# Patient Record
Sex: Female | Born: 1975 | Race: White | Hispanic: No | Marital: Married | Smoking: Never smoker
Health system: Southern US, Community
[De-identification: ages and names within clinical notes are randomized; demographics above are authoritative.]

## PROBLEM LIST (undated history)

## (undated) DIAGNOSIS — E039 Hypothyroidism, unspecified: Secondary | ICD-10-CM

## (undated) DIAGNOSIS — J45909 Unspecified asthma, uncomplicated: Secondary | ICD-10-CM

## (undated) DIAGNOSIS — B019 Varicella without complication: Secondary | ICD-10-CM

## (undated) DIAGNOSIS — E079 Disorder of thyroid, unspecified: Secondary | ICD-10-CM

## (undated) DIAGNOSIS — Z8679 Personal history of other diseases of the circulatory system: Secondary | ICD-10-CM

## (undated) DIAGNOSIS — R011 Cardiac murmur, unspecified: Secondary | ICD-10-CM

## (undated) DIAGNOSIS — K635 Polyp of colon: Secondary | ICD-10-CM

## (undated) DIAGNOSIS — R001 Bradycardia, unspecified: Secondary | ICD-10-CM

## (undated) DIAGNOSIS — K529 Noninfective gastroenteritis and colitis, unspecified: Secondary | ICD-10-CM

## (undated) DIAGNOSIS — R002 Palpitations: Secondary | ICD-10-CM

## (undated) DIAGNOSIS — I499 Cardiac arrhythmia, unspecified: Secondary | ICD-10-CM

## (undated) DIAGNOSIS — I10 Essential (primary) hypertension: Secondary | ICD-10-CM

## (undated) DIAGNOSIS — Z9889 Other specified postprocedural states: Secondary | ICD-10-CM

## (undated) DIAGNOSIS — E0500 Thyrotoxicosis with diffuse goiter without thyrotoxic crisis or storm: Secondary | ICD-10-CM

## (undated) DIAGNOSIS — R112 Nausea with vomiting, unspecified: Secondary | ICD-10-CM

## (undated) DIAGNOSIS — Z973 Presence of spectacles and contact lenses: Secondary | ICD-10-CM

## (undated) DIAGNOSIS — M7989 Other specified soft tissue disorders: Secondary | ICD-10-CM

## (undated) DIAGNOSIS — I341 Nonrheumatic mitral (valve) prolapse: Secondary | ICD-10-CM

## (undated) DIAGNOSIS — M1990 Unspecified osteoarthritis, unspecified site: Secondary | ICD-10-CM

## (undated) DIAGNOSIS — K589 Irritable bowel syndrome without diarrhea: Secondary | ICD-10-CM

## (undated) DIAGNOSIS — T7840XA Allergy, unspecified, initial encounter: Secondary | ICD-10-CM

## (undated) DIAGNOSIS — R519 Headache, unspecified: Secondary | ICD-10-CM

## (undated) DIAGNOSIS — R51 Headache: Secondary | ICD-10-CM

## (undated) HISTORY — DX: Polyp of colon: K63.5

## (undated) HISTORY — DX: Noninfective gastroenteritis and colitis, unspecified: K52.9

## (undated) HISTORY — DX: Essential (primary) hypertension: I10

## (undated) HISTORY — DX: Varicella without complication: B01.9

## (undated) HISTORY — PX: TRANSTHORACIC ECHOCARDIOGRAM: SHX275

## (undated) HISTORY — DX: Allergy, unspecified, initial encounter: T78.40XA

## (undated) HISTORY — DX: Disorder of thyroid, unspecified: E07.9

## (undated) HISTORY — PX: TONSILECTOMY/ADENOIDECTOMY WITH MYRINGOTOMY: SHX6125

## (undated) HISTORY — PX: COLONOSCOPY: SHX174

## (undated) HISTORY — DX: Irritable bowel syndrome without diarrhea: K58.9

## (undated) HISTORY — DX: Unspecified osteoarthritis, unspecified site: M19.90

## (undated) HISTORY — DX: Palpitations: R00.2

## (undated) HISTORY — PX: MYRINGOTOMY: SUR874

---

## 1998-01-07 DIAGNOSIS — R002 Palpitations: Secondary | ICD-10-CM

## 1998-01-07 HISTORY — DX: Palpitations: R00.2

## 1999-01-08 HISTORY — PX: CHOLECYSTECTOMY: SHX55

## 2002-01-07 DIAGNOSIS — K589 Irritable bowel syndrome without diarrhea: Secondary | ICD-10-CM

## 2002-01-07 DIAGNOSIS — K529 Noninfective gastroenteritis and colitis, unspecified: Secondary | ICD-10-CM

## 2002-01-07 HISTORY — DX: Noninfective gastroenteritis and colitis, unspecified: K52.9

## 2002-01-07 HISTORY — DX: Irritable bowel syndrome, unspecified: K58.9

## 2004-12-27 ENCOUNTER — Ambulatory Visit: Payer: Self-pay

## 2006-05-26 ENCOUNTER — Ambulatory Visit: Payer: Self-pay | Admitting: Family Medicine

## 2006-05-28 ENCOUNTER — Ambulatory Visit: Payer: Self-pay | Admitting: Internal Medicine

## 2006-10-04 LAB — HM COLONOSCOPY: HM Colonoscopy: NORMAL

## 2006-10-13 ENCOUNTER — Ambulatory Visit: Payer: Self-pay | Admitting: Internal Medicine

## 2006-11-05 ENCOUNTER — Ambulatory Visit: Payer: Self-pay | Admitting: Gastroenterology

## 2006-12-10 ENCOUNTER — Ambulatory Visit: Payer: Self-pay | Admitting: Internal Medicine

## 2007-10-08 ENCOUNTER — Encounter: Admission: RE | Admit: 2007-10-08 | Discharge: 2007-10-08 | Payer: Self-pay | Admitting: Oral Surgery

## 2008-01-08 HISTORY — PX: TUBAL LIGATION: SHX77

## 2008-06-24 ENCOUNTER — Inpatient Hospital Stay (HOSPITAL_COMMUNITY): Admission: RE | Admit: 2008-06-24 | Discharge: 2008-06-27 | Payer: Self-pay | Admitting: Obstetrics and Gynecology

## 2008-06-24 ENCOUNTER — Encounter (INDEPENDENT_AMBULATORY_CARE_PROVIDER_SITE_OTHER): Payer: Self-pay | Admitting: Obstetrics and Gynecology

## 2008-07-08 ENCOUNTER — Inpatient Hospital Stay (HOSPITAL_COMMUNITY): Admission: AD | Admit: 2008-07-08 | Discharge: 2008-07-08 | Payer: Self-pay | Admitting: Obstetrics and Gynecology

## 2008-09-30 ENCOUNTER — Ambulatory Visit: Payer: Self-pay | Admitting: Anesthesiology

## 2008-10-11 ENCOUNTER — Encounter: Payer: Self-pay | Admitting: Internal Medicine

## 2008-11-07 ENCOUNTER — Encounter: Payer: Self-pay | Admitting: Internal Medicine

## 2010-01-07 HISTORY — PX: CARPAL TUNNEL RELEASE: SHX101

## 2010-04-15 LAB — URINALYSIS, ROUTINE W REFLEX MICROSCOPIC
Bilirubin Urine: NEGATIVE
Glucose, UA: NEGATIVE mg/dL
Specific Gravity, Urine: <1.005 — ABNORMAL LOW (ref 1.005–1.030)
Urobilinogen, UA: 0.2 mg/dL (ref 0.0–1.0)
pH: 6.5 (ref 5.0–8.0)

## 2010-04-15 LAB — URINE MICROSCOPIC-ADD ON

## 2010-04-15 LAB — COMPREHENSIVE METABOLIC PANEL
ALT: 17 U/L (ref 0–35)
AST: 14 U/L (ref 0–37)
Alkaline Phosphatase: 55 U/L (ref 39–117)
BUN: 6 mg/dL (ref 6–23)
Calcium: 8.9 mg/dL (ref 8.4–10.5)
Total Bilirubin: 1.1 mg/dL (ref 0.3–1.2)
Total Protein: 6.3 g/dL (ref 6.0–8.3)

## 2010-04-15 LAB — CBC
HCT: 33.9 % — ABNORMAL LOW (ref 36.0–46.0)
MCV: 92.3 fL (ref 78.0–100.0)
Platelets: 253 10*3/uL (ref 150–400)
RBC: 3.68 MIL/uL — ABNORMAL LOW (ref 3.87–5.11)
RDW: 15 % (ref 11.5–15.5)
WBC: 9.9 10*3/uL (ref 4.0–10.5)

## 2010-04-15 LAB — URINE CULTURE: Colony Count: 60000

## 2010-04-16 LAB — CBC
HCT: 32 % — ABNORMAL LOW (ref 36.0–46.0)
Hemoglobin: 9.9 g/dL — ABNORMAL LOW (ref 12.0–15.0)
MCV: 95.9 fL (ref 78.0–100.0)
RBC: 3.38 MIL/uL — ABNORMAL LOW (ref 3.87–5.11)
WBC: 9.6 10*3/uL (ref 4.0–10.5)

## 2010-05-22 NOTE — H&P (Signed)
[name], [name]                  ACCOUNT NO.:  [id number]   MEDICAL RECORD NO.:  [id number]          PATIENT TYPE:  MAT   LOCATION:  [facility]                          FACILITY:  [facility]   PHYSICIAN:  Lenoard Aden, M.D.DATE OF BIRTH:  02/12/1975   DATE OF ADMISSION:  07/08/2008  DATE OF DISCHARGE:  07/08/2008                              HISTORY & PHYSICAL   CHIEF COMPLAINT:  Headache with hypertension.   The patient is a 32-year white female, G2, P2, now 1 week and 10-12  days, status post uncomplicated repeat C-section, tubal ligation, who  presents with onset of headache and increased discomfort today.  She is  a nonsmoker, nondrinker.  Denies domestic or physical violence.   MEDICATIONS:  Prenatal vitamins, Percocet, and ibuprofen.   FAMILY HISTORY:  Noncontributory.   SOCIAL HISTORY:  History of 2 C-sections as noted with tubal ligation.   PHYSICAL EXAMINATION:  GENERAL:  She is a well-developed and well-  nourished white female in no acute distress.  VITAL SIGNS:  Initial blood pressure is 171/91.  Recent blood pressure  is 144/70.  HEENT:  Normal.  LUNGS:  Clear.  HEART:  Regular rhythm.  ABDOMEN:  Soft, obese, and nontender.  Incision is well-healed.  EXTREMITIES:  No cords.  NEUROLOGIC:  Nonfocal.  SKIN:  Intact.   LABORATORY DATA:  Normal CBC with platelet count of 253,000.  Urinalysis  with large hemoglobin and moderate leukocyte esterase.  A complete  metabolic panel, which was within normal limits with normal SGOT, normal  SGPT, and normal LDH.   IMPRESSION:  Gestational hypertension with postpartum exacerbation.   PLAN:  Discharge home.  Pre-eclamptic precautions given.  Seizure  precautions given.  Labetalol 100 mg p.o. b.i.d. given.  Percocet 1-2  p.o. q.4-6 h.  Followup in the office in 4 days.  Precautions given.      Lenoard Aden, M.D.  Electronically Signed    RJT/MEDQ  D:  07/08/2008  T:  07/09/2008  Job:  [id number]

## 2010-05-22 NOTE — Op Note (Signed)
[name], [name]                  ACCOUNT NO.:  [id number]   MEDICAL RECORD NO.:  [id number]          PATIENT TYPE:  INP   LOCATION:  9129                          [facility]ACILITY:  [facility]   PHYSICIAN:  Lenoard Aden, M.D.DATE OF BIRTH:  [birth year]   DATE OF PROCEDURE:  06/24/2008  DATE OF DISCHARGE:                               OPERATIVE REPORT   PREOPERATIVE DIAGNOSES:  Previous cesarean section, 39 and 0/7 weeks,  desire for elective sterilization.   POSTOPERATIVE DIAGNOSES:  Previous cesarean section, 39 and 0/7 weeks,  desire for elective sterilization.   PROCEDURES:  Repeat cesarean section, lysis of adhesions, and tubal  ligation.   SURGEON:  Lenoard Aden, MD   ASSISTANT:  Dineen Kid. Rana Snare, MD   ANESTHESIA:  Spinal by [place].   ESTIMATED BLOOD LOSS:  900 mL.   COMPLICATIONS:  None.   DRAINS:  Foley.   COUNTS:  Correct.   DISPOSITION:  The patient to Recovery in good condition.   FINDINGS:  Full-term living female, occiput transverse position, vacuum  assistance one pole, Apgars 9 and 9, pediatricians in attendance.  Normal tubes, normal ovaries, and omental adhesions to lower uterine  segment and bladder flap.   BRIEF OPERATIVE NOTE:  After being apprised of risks of anesthesia,  infection, bleeding, need for repair, delayed versus immediate  complications to include bowel and bladder injury, failure risk of tubal  ligation, 5-10 per 1000. The patient brought to the operating, she was  administered spinal anesthetic without complications, and prepped and  draped in usual sterile fashion.  Foley catheter placed after achieving  adequate anesthesia, dilute Marcaine solution placed.  Pfannenstiel skin  incision was made with scalpel, carried down to fascia which nicked in  the midline and opened transverse using Mayo scissors.  Peritoneum  entered sharply and then the omentum was encountered, draped along the  lower uterine segment and bladder flap.  This was  lysed using  electrocautery with caution to avoid the bladder.  No bowel was involved  in the adhesive process.  After clearing of the adhesions in the lower  uterine segment, the visceral peritoneum scored off the lower uterine  segment.  Sharl Ma hysterotomy incision was made, atraumatic delivery of a  full term living female with vacuum assistance x1 pull, handed to  pediatricians staff in attendance.  Apgars 9 and 9.  Cord blood  collected.  Placenta delivered manually intact.  Uterus exteriorized,  curetted using a dry lap pack and closed in 1 running layer of 0  Monocryl suture.  Good hemostasis noted.  Left tube traced out to the  fimbriated end and ampullary-isthmic portion of the tube was isolated.  Mesosalpinx was cauterized, creating a window in an avascular portion, 0  plain ties were placed x2.  Tubal segment excised after this procedure  was done on the left tube.  The same exact procedure was done on the  right tube and tubal segments excised as well.  Tubal segments and tubal  lumens were visualized, cauterized, and good hemostasis noted.  Uterus  was replaced and the abdominal  cavity reinspected.  Irrigation was  accomplished.  Good hemostasis was ensured.  Fascia then closed using  the 0 Monocryl in a continuous running fashion.  Skin was closed using  staples.  The patient tolerated the procedure well and transferred to  recovery in good condition.      Lenoard Aden, M.D.  Electronically Signed     RJT/MEDQ  D:  06/24/2008  T:  06/24/2008  Job:  [id number]

## 2010-07-04 LAB — HM PAP SMEAR: HM Pap smear: NORMAL

## 2010-09-06 ENCOUNTER — Ambulatory Visit: Payer: Self-pay | Admitting: Orthopedic Surgery

## 2010-09-11 LAB — PATHOLOGY REPORT

## 2010-11-23 ENCOUNTER — Ambulatory Visit: Payer: Self-pay | Admitting: Internal Medicine

## 2012-01-08 HISTORY — PX: OTHER SURGICAL HISTORY: SHX169

## 2012-07-22 ENCOUNTER — Ambulatory Visit: Payer: Self-pay | Admitting: Internal Medicine

## 2012-09-01 ENCOUNTER — Other Ambulatory Visit: Payer: Self-pay | Admitting: Internal Medicine

## 2012-09-01 DIAGNOSIS — R1310 Dysphagia, unspecified: Secondary | ICD-10-CM

## 2012-09-11 ENCOUNTER — Ambulatory Visit
Admission: RE | Admit: 2012-09-11 | Discharge: 2012-09-11 | Disposition: A | Discharge status: Home / Self Care | Payer: 59 | Source: Ambulatory Visit | Attending: Internal Medicine | Admitting: Internal Medicine

## 2012-09-11 DIAGNOSIS — R1310 Dysphagia, unspecified: Secondary | ICD-10-CM

## 2012-10-02 ENCOUNTER — Ambulatory Visit (INDEPENDENT_AMBULATORY_CARE_PROVIDER_SITE_OTHER): Payer: 59 | Admitting: Internal Medicine

## 2012-10-02 ENCOUNTER — Encounter: Payer: Self-pay | Admitting: Internal Medicine

## 2012-10-02 VITALS — BP 132/78 | HR 80 | Temp 97.9°F | Resp 14 | Ht 64.75 in | Wt 196.8 lb

## 2012-10-02 DIAGNOSIS — E559 Vitamin D deficiency, unspecified: Secondary | ICD-10-CM

## 2012-10-02 DIAGNOSIS — I471 Supraventricular tachycardia, unspecified: Secondary | ICD-10-CM

## 2012-10-02 DIAGNOSIS — E039 Hypothyroidism, unspecified: Secondary | ICD-10-CM

## 2012-10-02 DIAGNOSIS — K5289 Other specified noninfective gastroenteritis and colitis: Secondary | ICD-10-CM

## 2012-10-02 DIAGNOSIS — R928 Other abnormal and inconclusive findings on diagnostic imaging of breast: Secondary | ICD-10-CM

## 2012-10-02 DIAGNOSIS — R002 Palpitations: Secondary | ICD-10-CM

## 2012-10-02 DIAGNOSIS — E669 Obesity, unspecified: Secondary | ICD-10-CM

## 2012-10-02 DIAGNOSIS — K529 Noninfective gastroenteritis and colitis, unspecified: Secondary | ICD-10-CM

## 2012-10-02 DIAGNOSIS — Z1239 Encounter for other screening for malignant neoplasm of breast: Secondary | ICD-10-CM

## 2012-10-02 DIAGNOSIS — R609 Edema, unspecified: Secondary | ICD-10-CM

## 2012-10-02 LAB — CBC WITH DIFFERENTIAL/PLATELET
Basophils Relative: 0.4 % (ref 0.0–3.0)
Eosinophils Absolute: 0.1 10*3/uL (ref 0.0–0.7)
HCT: 37 % (ref 36.0–46.0)
Lymphocytes Relative: 15.6 % (ref 12.0–46.0)
Lymphs Abs: 0.9 10*3/uL (ref 0.7–4.0)
MCHC: 35.2 g/dL (ref 30.0–36.0)
MCV: 87.9 fl (ref 78.0–100.0)
Monocytes Absolute: 0.3 10*3/uL (ref 0.1–1.0)
Neutro Abs: 4.3 10*3/uL (ref 1.4–7.7)
Neutrophils Relative %: 77.6 % — ABNORMAL HIGH (ref 43.0–77.0)
Platelets: 219 10*3/uL (ref 150.0–400.0)
RBC: 4.21 Mil/uL (ref 3.87–5.11)
RDW: 13.5 % (ref 11.5–14.6)
WBC: 5.6 10*3/uL (ref 4.5–10.5)

## 2012-10-02 LAB — COMPREHENSIVE METABOLIC PANEL
AST: 17 U/L (ref 0–37)
Albumin: 4.6 g/dL (ref 3.5–5.2)
CO2: 29 mEq/L (ref 19–32)
Calcium: 9.4 mg/dL (ref 8.4–10.5)
Creatinine, Ser: 0.6 mg/dL (ref 0.4–1.2)
Glucose, Bld: 90 mg/dL (ref 70–99)
Sodium: 137 mEq/L (ref 135–145)
Total Bilirubin: 1.1 mg/dL (ref 0.3–1.2)

## 2012-10-02 LAB — MICROALBUMIN / CREATININE URINE RATIO
Microalb Creat Ratio: 0.9 mg/g (ref 0.0–30.0)
Microalb, Ur: 0.2 mg/dL (ref 0.0–1.9)

## 2012-10-02 MED ORDER — PROPRANOLOL HCL 20 MG PO TABS: ORAL_TABLET | ORAL | Status: DC

## 2012-10-02 NOTE — Patient Instructions (Addendum)
Return in 3 months for your physical  Trial of propanol for next run of SVT  Referral to [facility] for thyroid follow up  [facility] call you or e mail you with lab results and referral info  I recommend a baseline mammogram

## 2012-10-02 NOTE — Progress Notes (Addendum)
Patient ID: [name], female   DOB: Mar 24, 1975, 37 y.o.   MRN: [id number]  Patient Active Problem List   Diagnosis Date Noted  . Hypothyroidism (acquired) 10/03/2012  . Obesity, unspecified 10/03/2012  . Colitis, nonspecific 10/03/2012  . Edema 10/03/2012  . Paroxysmal SVT (supraventricular tachycardia) 10/03/2012    Subjective:  CC:   Chief Complaint  Patient presents with  . Establish Care    HPI:   [name] is a 37 y.o. female who presents as a new patient to establish primary care with the chief complaint of  1) chronic diarrhea  2) fluid retention 3) inability to lose desire weight  4) palpitations  5)  underactive thyroid.  She has had loose stools for years,  With no histroy fo bloody stools.  Prior colonoscopy done . No cramping or unintentional weight loss.  No FH of IBD 2) fluid retention , legs and hands,  Not cyclical.  No priro workup.  3) follows a healthy diet but does not exercise regularly,. No history of DM. 4) Has been told she has SVT and mitral prolpse.  Episodes occur at rest.  5) thyriod supplementation for years.   Past Medical History  Diagnosis Date  . Arthritis   . Chicken pox   . Allergy   . Hypertension   . Thyroid disease   . Irritable bowel syndrome (IBS) 2004  . Colitis 2004  . Colon polyps   . Palpitations 2000    Past Surgical History  Procedure Laterality Date  . Cholecystectomy  2001  . Tonsilectomy/adenoidectomy with myringotomy    . Cesarean section  2006,2010  . Tubal ligation  2010  . Carpal tunnel release Left 2012    Two cyst removal    Family History  Problem Relation Age of Onset  . Alcohol abuse Mother   . Arthritis Mother   . Hyperlipidemia Mother   . Hypertension Mother   . Arthritis Father   . Stroke Maternal Grandfather   . Heart disease Maternal Grandfather   . Heart disease Paternal Grandfather     History   Social History  . Marital Status: Married    Spouse Name: N/A    Number of Children:  N/A  . Years of Education: N/A   Occupational History  . Not on file.   Social History Main Topics  . Smoking status: Never Smoker   . Smokeless tobacco: Never Used  . Alcohol Use: Yes  . Drug Use: No  . Sexual Activity: Yes   Other Topics Concern  . Not on file   Social History Narrative  . No narrative on file       @ALLHX @    Review of Systems:   The remainder of the review of systems was negative except those addressed in the HPI.       Objective:  BP 132/78  Pulse 80  Temp(Src) 97.9 F (36.6 C) (Oral)  Resp 14  Ht 5' 4.75" (1.645 m)  Wt 196 lb 12 oz (89.245 kg)  BMI 32.98 kg/m2  SpO2 99%  LMP 09/17/2012  General appearance: alert, cooperative and appears stated age Ears: normal TM's and external ear canals both ears Throat: lips, mucosa, and tongue normal; teeth and gums normal Neck: no adenopathy, no carotid bruit, supple, symmetrical, trachea midline and thyroid not enlarged, symmetric, no tenderness/mass/nodules Back: symmetric, no curvature. ROM normal. No CVA tenderness. Lungs: clear to auscultation bilaterally Heart: regular rate and rhythm, S1, S2 normal, no murmur, click,  rub or gallop Abdomen: soft, non-tender; bowel sounds normal; no masses,  no organomegaly Pulses: 2+ and symmetric Skin: Skin color, texture, turgor normal. No rashes or lesions Lymph nodes: Cervical, supraclavicular, and axillary nodes normal.  Assessment and Plan:  Colitis, nonspecific Chronic diarrhea, managed with dietary modifications ,.  Records requested.   Hypothyroidism (acquired) etiology may have been autoimmune thyroiditis, given patient's report of elevated antibodies and thyromegaly.  Requesting records and Endocrine referral   Obesity, unspecified She has addressed  BMI and I recommended wt loss of 10% of body weight over the next 6 months using a low glycemic index diet and regular exercise a minimum of 5 days per week.    Edema Generalized ,  managed with HCTZ.  Checking urine for proteinuria,  thyroid function.  Refer to [facility] for rule out VI  Paroxysmal SVT (supraventricular tachycardia) Short acting inderal given to use prn. Has MVP  A total of 60 minutes was spent with patient more than half of which was spent in counseling, reviewing records from other prviders and coordination of care. Updated Medication List Outpatient Encounter Prescriptions as of 10/02/2012  Medication Sig Dispense Refill  . fexofenadine (ALLEGRA) 180 MG tablet Take 180 mg by mouth daily.      . fluticasone (FLONASE) 50 MCG/ACT nasal spray Place 2 sprays into the nose daily.      . hydrochlorothiazide (HYDRODIURIL) 25 MG tablet Take 25 mg by mouth daily.      Marland Kitchen levothyroxine (SYNTHROID, LEVOTHROID) 88 MCG tablet Take 88 mcg by mouth daily before breakfast.      . methocarbamol (ROBAXIN) 750 MG tablet Take 750 mg by mouth 4 (four) times daily as needed.      . propranolol (INDERAL) 20 MG tablet As needed for SVT  30 tablet  3   No facility-administered encounter medications on file as of 10/02/2012.     Orders Placed This Encounter  Procedures  . MM Digital Screening  . CBC with Differential  . Comprehensive metabolic panel  . Vit D  25 hydroxy (rtn osteoporosis monitoring)  . Magnesium  . TSH + free T4  . Microalbumin / creatinine urine ratio  . HM PAP SMEAR  . Ambulatory referral to Endocrinology  . HM COLONOSCOPY  . HM COLONOSCOPY  . HM COLONOSCOPY    No Follow-up on file.

## 2012-10-03 ENCOUNTER — Encounter: Payer: Self-pay | Admitting: Internal Medicine

## 2012-10-03 DIAGNOSIS — E669 Obesity, unspecified: Secondary | ICD-10-CM | POA: Insufficient documentation

## 2012-10-03 DIAGNOSIS — K529 Noninfective gastroenteritis and colitis, unspecified: Secondary | ICD-10-CM | POA: Insufficient documentation

## 2012-10-03 DIAGNOSIS — I471 Supraventricular tachycardia, unspecified: Secondary | ICD-10-CM | POA: Insufficient documentation

## 2012-10-03 DIAGNOSIS — E039 Hypothyroidism, unspecified: Secondary | ICD-10-CM | POA: Insufficient documentation

## 2012-10-03 DIAGNOSIS — R609 Edema, unspecified: Secondary | ICD-10-CM | POA: Insufficient documentation

## 2012-10-03 LAB — TSH+FREE T4: Free T4: 3.22 ng/dL — ABNORMAL HIGH (ref 0.82–1.77)

## 2012-10-03 LAB — VITAMIN D 25 HYDROXY (VIT D DEFICIENCY, FRACTURES): Vit D, 25-Hydroxy: 33 ng/mL (ref 30–89)

## 2012-10-03 NOTE — Assessment & Plan Note (Signed)
Chronic diarrhea, managed with dietary modifications ,.  Records requested.

## 2012-10-03 NOTE — Assessment & Plan Note (Signed)
She has addressed  BMI and I recommended wt loss of 10% of body weight over the next 6 months using a low glycemic index diet and regular exercise a minimum of 5 days per week.

## 2012-10-03 NOTE — Assessment & Plan Note (Signed)
Generalized , managed with HCTZ.  Checking urine for proteinuria,  thyroid function.  Refer to [facility] for rule out VI

## 2012-10-03 NOTE — Assessment & Plan Note (Addendum)
etiology may have been autoimmune thyroiditis, given patient's report of elevated antibodies and thyromegaly.  Requesting records and Endocrine referral

## 2012-10-03 NOTE — Assessment & Plan Note (Signed)
Short acting inderal given to use prn. Has MVP

## 2012-10-05 ENCOUNTER — Encounter: Payer: Self-pay | Admitting: Internal Medicine

## 2012-10-05 NOTE — Addendum Note (Signed)
Addended by: Sherlene Shams on: 10/05/2012 07:04 AM   Modules accepted: Orders

## 2012-10-12 LAB — HM MAMMOGRAPHY

## 2012-10-14 ENCOUNTER — Telehealth: Payer: Self-pay | Admitting: Internal Medicine

## 2012-10-14 DIAGNOSIS — R928 Other abnormal and inconclusive findings on diagnostic imaging of breast: Secondary | ICD-10-CM | POA: Insufficient documentation

## 2012-10-14 NOTE — Addendum Note (Signed)
Addended by: Sherlene Shams on: 10/14/2012 12:38 PM   Modules accepted: Orders

## 2012-10-14 NOTE — Telephone Encounter (Signed)
Patient already s/u on Aprril 3rd at 1030 per [facility]

## 2012-10-14 NOTE — Telephone Encounter (Signed)
Her additional views on the left breast were normal.  [facility] is recommending 6 month follow up on left breast, however, so I have ordered it. Please notfiy patient.

## 2012-10-18 ENCOUNTER — Telehealth: Payer: Self-pay | Admitting: Internal Medicine

## 2012-10-18 DIAGNOSIS — E039 Hypothyroidism, unspecified: Secondary | ICD-10-CM

## 2012-10-22 ENCOUNTER — Encounter: Payer: Self-pay | Admitting: Emergency Medicine

## 2012-11-12 ENCOUNTER — Telehealth: Payer: Self-pay | Admitting: Internal Medicine

## 2012-11-12 NOTE — Telephone Encounter (Signed)
Have her come in for the blood work as directed and make an appt to discuss the symptoms

## 2012-11-12 NOTE — Telephone Encounter (Signed)
Pt level vm.  States she was seen approx 6 wk ago, thyroid was high, and she was told to stop thyroid med and come back for blood work.  Pt states it is about time to come back in for lab work.  Would like Dr. Darrick Huntsman to know that she is aching all over and that her joints ache, she is tired and hurts all the time.

## 2012-11-13 NOTE — Telephone Encounter (Signed)
Pt notified and verbalized understanding. Scheduled appt for blood work, will schedule appointment with Dr. Darrick Huntsman after blood work is back.

## 2012-11-16 ENCOUNTER — Other Ambulatory Visit (INDEPENDENT_AMBULATORY_CARE_PROVIDER_SITE_OTHER): Payer: 59

## 2012-11-16 ENCOUNTER — Other Ambulatory Visit: Payer: 59

## 2012-11-16 DIAGNOSIS — E039 Hypothyroidism, unspecified: Secondary | ICD-10-CM

## 2012-11-17 LAB — TSH+FREE T4: Free T4: 2.36 ng/dL — ABNORMAL HIGH (ref 0.82–1.77)

## 2012-11-19 ENCOUNTER — Telehealth: Payer: Self-pay | Admitting: Internal Medicine

## 2012-11-19 DIAGNOSIS — E0590 Thyrotoxicosis, unspecified without thyrotoxic crisis or storm: Secondary | ICD-10-CM

## 2012-11-19 MED ORDER — LEVOTHYROXINE SODIUM 50 MCG PO TABS: 50.0000 ug | ORAL_TABLET | Freq: Every day | ORAL | Status: DC

## 2012-11-19 MED ORDER — LEVOTHYROXINE SODIUM 50 MCG PO TABS: 88.0000 ug | ORAL_TABLET | Freq: Every day | ORAL | Status: DC

## 2012-11-19 NOTE — Telephone Encounter (Signed)
Her thyroid remains very overactive on cuyrrent Synthroid dose.    She needs to stop the medication ASAP ,  I will  Print rx  For 50 mcg daily ,. please call in

## 2012-11-19 NOTE — Telephone Encounter (Signed)
Pt left vm.  Had blood work Monday.  Has not heard results.

## 2012-11-19 NOTE — Telephone Encounter (Signed)
What would you like me to tell her? I did not see a result note yet

## 2012-11-20 NOTE — Telephone Encounter (Signed)
She has an appt with Dr Lafe Garin on Dec 3rd to discuss thyroid issues. I believe she has become hyperthyroid ,  She definitely does not need the thyroid medication.  She can use ibuprofen and tylenol for the muscle aches , and I am going to set her up for a thyroid ultrasound and a 24 hr  Uptake scan prior to her appt with Gherge.

## 2012-11-20 NOTE — Telephone Encounter (Signed)
Pt notified and verbalized understanding.

## 2012-11-20 NOTE — Telephone Encounter (Signed)
Pt notified, has been off thyroid medication as directed since last bloodwork over a month ago. Pt states every muscle and joint aches, thyroid has increased in size, and sluggish.

## 2012-11-23 ENCOUNTER — Ambulatory Visit: Payer: Self-pay | Admitting: Internal Medicine

## 2012-11-24 ENCOUNTER — Telehealth: Payer: Self-pay | Admitting: Internal Medicine

## 2012-11-24 ENCOUNTER — Ambulatory Visit: Payer: Self-pay | Admitting: Internal Medicine

## 2012-11-24 DIAGNOSIS — E039 Hypothyroidism, unspecified: Secondary | ICD-10-CM

## 2012-11-24 DIAGNOSIS — I471 Supraventricular tachycardia: Secondary | ICD-10-CM

## 2012-11-24 NOTE — Telephone Encounter (Signed)
The ultrasoudndconfirmed several (3) small nodules  rtoo small to biopsy ,  And mild thyroid enlargement  .  The 24 hr uptake scan which has been ordered will help determine if the nodules are hyperfunctioning and causing the overactive thyroid.  Has she been scheduled for it yet?

## 2012-11-24 NOTE — Assessment & Plan Note (Signed)
Repeat ultrasound now showing 3 nodules and mild thyromegaly.  Uptake scan and endocrine consult in process

## 2012-11-24 NOTE — Assessment & Plan Note (Signed)
Likely secondary to conversion to hyperthyroidism based on repeat TSH and Free T4 .  Workup underway

## 2012-11-24 NOTE — Telephone Encounter (Signed)
See prior unrouted note re thyroid ultrasound

## 2012-11-25 NOTE — Telephone Encounter (Signed)
Has been scheduled at 9.30 to day.

## 2012-11-26 ENCOUNTER — Telehealth: Payer: Self-pay | Admitting: Internal Medicine

## 2012-11-26 DIAGNOSIS — E0500 Thyrotoxicosis with diffuse goiter without thyrotoxic crisis or storm: Secondary | ICD-10-CM | POA: Insufficient documentation

## 2012-11-26 DIAGNOSIS — E0590 Thyrotoxicosis, unspecified without thyrotoxic crisis or storm: Secondary | ICD-10-CM

## 2012-11-26 NOTE — Assessment & Plan Note (Addendum)
Patient seen on intial visit Sept 2014 with history of hypothyroidism treated by prior PCP. History of goiter,  TSH was suppressed,  Synthroid was stopped.  Repeat Tsh still suppressed.  Ultrasound noted 3 tiny nodules.  Uptake scan showed uniform and increased uptake of iodide  I 131 (see overview) . Endocrine referral is pending. Patient has inderal to use prn.

## 2012-11-26 NOTE — Telephone Encounter (Signed)
Patient scheduled tomorrow at 2.30 to see Dr. Darrick Huntsman

## 2012-11-26 NOTE — Telephone Encounter (Signed)
Uptake scan confirmed hyperthyroidism  . Endocrine referral is pending. Patient has inderal to use prn fast heart rate.  There are additional blood tests that can be done and medication can be started prior to her appt with Dr Lafe Garin but I will need to see her in an office visit.

## 2012-11-27 ENCOUNTER — Encounter: Payer: Self-pay | Admitting: Internal Medicine

## 2012-11-27 ENCOUNTER — Ambulatory Visit (INDEPENDENT_AMBULATORY_CARE_PROVIDER_SITE_OTHER): Payer: 59 | Admitting: Internal Medicine

## 2012-11-27 VITALS — BP 118/78 | HR 87 | Temp 98.1°F | Resp 12 | Ht 65.5 in | Wt 189.5 lb

## 2012-11-27 DIAGNOSIS — E0590 Thyrotoxicosis, unspecified without thyrotoxic crisis or storm: Secondary | ICD-10-CM

## 2012-11-27 LAB — CK: Total CK: 24 U/L (ref 7–177)

## 2012-11-27 MED ORDER — METHIMAZOLE 5 MG PO TABS: 5.0000 mg | ORAL_TABLET | Freq: Three times a day (TID) | ORAL | Status: DC

## 2012-11-27 MED ORDER — PROPRANOLOL HCL 20 MG PO TABS: 20.0000 mg | ORAL_TABLET | Freq: Three times a day (TID) | ORAL | Status: DC

## 2012-11-27 MED ORDER — METHOCARBAMOL 750 MG PO TABS: 750.0000 mg | ORAL_TABLET | Freq: Four times a day (QID) | ORAL | Status: DC | PRN

## 2012-11-27 NOTE — Progress Notes (Signed)
Patient ID: [name], female   DOB: 04-19-1975, 37 y.o.   MRN: [id number]  Patient Active Problem List   Diagnosis Date Noted  . Hyperthyroidism 11/26/2012  . Abnormal mammogram 10/14/2012  . Obesity, unspecified 10/03/2012  . Colitis, nonspecific 10/03/2012  . Edema 10/03/2012  . Paroxysmal SVT (supraventricular tachycardia) 10/03/2012    Subjective:  CC:   Chief Complaint  Patient presents with  . Hyperthyroidism  . Follow-up    Scan    HPI:   [name] a 37 y.o. female who presents for follow up on recent diagnosis of hyperthryoidism.  Patient has been under treatment for goiter and hypothyroidism by Dr. Nada Maclachlan for the last 2 years.  Recent TSh was supprressed,  Thyroid medication was stopped, repeat TSH wtill suppressed,   thyroid ultrasound showed  3 < 1 cm nodules , 24 hr radioiodide Uptake scan diffuse increased uptake.   Feels terrible,  joints and muscles aching,  Tired all the time. Using inderal 20 mg bid.Marland Kitchen  Losing weight but this is intentional.    Starting methimazole 5 mf tid toa and increasing propranalol today to tid.    Has appt with gherge dec 3  Thyroid ab and ck ordered for today   Past Medical History  Diagnosis Date  . Arthritis   . Chicken pox   . Allergy   . Hypertension   . Thyroid disease   . Irritable bowel syndrome (IBS) 2004  . Colitis 2004  . Colon polyps   . Palpitations 2000    Past Surgical History  Procedure Laterality Date  . Cholecystectomy  2001  . Tonsilectomy/adenoidectomy with myringotomy    . Cesarean section  2006,2010  . Tubal ligation  2010  . Carpal tunnel release Left 2012    Two cyst removal       The following portions of the patient's history were reviewed and updated as appropriate: Allergies, current medications, and problem list.    Review of Systems:   12 Pt  review of systems was negative except those addressed in the HPI,     History   Social History  . Marital Status: Married   Spouse Name: N/A    Number of Children: N/A  . Years of Education: N/A   Occupational History  . Not on file.   Social History Main Topics  . Smoking status: Never Smoker   . Smokeless tobacco: Never Used  . Alcohol Use: Yes  . Drug Use: No  . Sexual Activity: Yes   Other Topics Concern  . Not on file   Social History Narrative  . No narrative on file    Objective:  Filed Vitals:   11/27/12 1457  BP: 118/78  Pulse: 87  Temp: 98.1 F (36.7 C)  Resp: 12     General appearance: alert, cooperative and appears stated age Ears: normal TM's and external ear canals both ears Throat: lips, mucosa, and tongue normal; teeth and gums normal Neck: no adenopathy, no carotid bruit, supple, symmetrical, trachea midline and thyroid not enlarged, symmetric, no tenderness/mass/nodules Back: symmetric, no curvature. ROM normal. No CVA tenderness. Lungs: clear to auscultation bilaterally Heart: regular rate and rhythm, S1, S2 normal, no murmur, click, rub or gallop Abdomen: soft, non-tender; bowel sounds normal; no masses,  no organomegaly Pulses: 2+ and symmetric Skin: Skin color, texture, turgor normal. No rashes or lesions Lymph nodes: Cervical, supraclavicular, and axillary nodes normal.  Assessment and Plan:  Hyperthyroidism History of hypothyroidism diagnosed by Dr.  Jidali in 2012 (?)  and treated with Synthroid .  Records requested but arrived incomplete. When she established with me in late September TSh was suppressed.  Synthroid was stopped and repeat TSH still suppressed,  T4 was elevated. August 2013 ultrasound showed goiter with no nodules (by [facility]). Repeat ultrasound Nov 2014 done at [facility] showed 3 sub centimeter nodules.  24 hr Uptake scan done Nov 18:  6 hr uptae 36%, 24 hr uptake 56.2% uniform,  No nodularity.. I have started her on methimazole 5 mg trid and increased inderal to tid as well,  CK is normal.  Thyroid antibodies are pending.  Referral to endocrine is  underway.     Updated Medication List Outpatient Encounter Prescriptions as of 11/27/2012  Medication Sig  . hydrochlorothiazide (HYDRODIURIL) 25 MG tablet Take 25 mg by mouth daily.  . methocarbamol (ROBAXIN) 750 MG tablet Take 1 tablet (750 mg total) by mouth 4 (four) times daily as needed.  . propranolol (INDERAL) 20 MG tablet Take 1 tablet (20 mg total) by mouth 3 (three) times daily. As needed for SVT  . [DISCONTINUED] methocarbamol (ROBAXIN) 750 MG tablet Take 750 mg by mouth 4 (four) times daily as needed.  . [DISCONTINUED] propranolol (INDERAL) 20 MG tablet As needed for SVT  . fexofenadine (ALLEGRA) 180 MG tablet Take 180 mg by mouth daily.  . fluticasone (FLONASE) 50 MCG/ACT nasal spray Place 2 sprays into the nose daily.  . methimazole (TAPAZOLE) 5 MG tablet Take 1 tablet (5 mg total) by mouth 3 (three) times daily.     Orders Placed This Encounter  Procedures  . TSH + free T4  . Thyroid antibodies  . Thyroid Peroxidase Antibody  . CK (Creatine Kinase)    No Follow-up on file.

## 2012-11-27 NOTE — Patient Instructions (Signed)
I AM STARTING YOU ON  Methimazole for hyperactive thyroid , take it 3 times daily  Can increase inderal to 3 times daily as well,  Ok to take ibuprofen and robaxin   We will request records again from Dr Nada Maclachlan

## 2012-11-27 NOTE — Progress Notes (Signed)
Pre-visit discussion using our clinic review tool. No additional management support is needed unless otherwise documented below in the visit note.  

## 2012-11-28 LAB — TSH+FREE T4: TSH: 0.005 u[IU]/mL — ABNORMAL LOW (ref 0.450–4.500)

## 2012-11-29 NOTE — Assessment & Plan Note (Signed)
History of hypothyroidism diagnosed by [facility] in 2012?  and treated with Synthroid .  Records requested but arrived incomplete. When she established with me in late September TSh was suppressed.  Synthroid was stopped and repeat TSH still suppressed,  T4 was elevated. August 2013 ultrasound showed goiter with no nodules (by [facility]). Repeat ultrasound Nov 2014 done at [facility] showed 3 sub centimeter nodules.  24 hr Uptake scan done Nov 18:  6 hr uptae 36%, 24 hr uptake 56.2% uniform,  No nodularity.. I have started her on methimazole 5 mg trid and increased inderal to tid as well,  CK is normal.  Thyroid antibodies are pending.  Referral to endocrine is underway.

## 2012-11-30 LAB — THYROID ANTIBODIES
Thyroglobulin Ab: 61.9 U/mL — ABNORMAL HIGH (ref <40.0)
Thyroperoxidase Ab SerPl-aCnc: 180 IU/mL — ABNORMAL HIGH (ref <35.0)

## 2012-12-09 ENCOUNTER — Ambulatory Visit (INDEPENDENT_AMBULATORY_CARE_PROVIDER_SITE_OTHER): Payer: 59 | Admitting: Internal Medicine

## 2012-12-09 ENCOUNTER — Telehealth: Payer: Self-pay | Admitting: *Deleted

## 2012-12-09 ENCOUNTER — Encounter: Payer: Self-pay | Admitting: Internal Medicine

## 2012-12-09 VITALS — BP 124/68 | HR 70 | Temp 98.1°F | Resp 10 | Ht 65.0 in | Wt 191.8 lb

## 2012-12-09 DIAGNOSIS — E0500 Thyrotoxicosis with diffuse goiter without thyrotoxic crisis or storm: Secondary | ICD-10-CM

## 2012-12-09 NOTE — Telephone Encounter (Signed)
Jan, from [facility] called and they need the report from the up take and scan from November 19th 2014 Fax # [phone]

## 2012-12-09 NOTE — Patient Instructions (Signed)
You have Graves disease. We will schedule RAI treatment for you at [facility] - you will be called with the appointment. Please stop Methimazole 4 days prior to the RAI treatment. Please come back for a follow-up appointment in 2 months.  Please stop the Methimazole (Tapazole) and call us or your primary care doctor if you develop: - sore throat - fever - yellow skin - dark urine - light colored stools As we will then need to check your blood counts and liver tests.

## 2012-12-09 NOTE — Progress Notes (Signed)
Patient ID: [name], female   DOB: [birth year], 37 y.o.   MRN: [id number]   HPI  [name] is a 37 y.o.-year-old female, referred by her PCP, Dr. 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  Pt has had high thyroid hormones and a suppressed TSH for the last 2 months. Previously, she has been dx with hypothyroidism and was on Levothyroxine for 1.5 years by Dr Milinda Cave - stopped in 09/2012 (she was on 88 mcg then).  I reviewed pt's thyroid tests: Lab Results  Component Value Date   TSH 0.005* 11/27/2012   TSH 0.005* 11/16/2012   TSH 0.010* 10/02/2012   FREET4 2.98* 11/27/2012   FREET4 2.36* 11/16/2012   FREET4 3.22* 10/02/2012    Pt had high anti-TPO (180, nl <35)  and anti-thyroglobulin antibodies (61.9, nl <40) (no TSI checked).  She has a thyroid U/S on 08/24/2012 at [facility]: - heterogeneous gland, no nodules  She had a thyroid U/S on 11/23/2012 (I do not have the report): - 3 subcm more nodules  She also had a thyroid Uptake and scan on 11/25/2012:  - 24-h uptake was high, at 56.2%, and the scan was homogeneous, c/w Graves ds.  She was started on MMI 5 mg tid on 11/27/2012 and Inderal 20 mg tid by PCP in 09/2012.  Pt denies feeling nodules in neck, hoarseness, dysphagia/odynophagia, SOB with lying down.  She c/o: - + fatigue in last 6 months, but worse in last 3 months - + mm and joint pain - + irritability - + anxiety - + lately heat intolerance - + weight loss - lost 20 lbs in 2.4 mo (partly intentional) - no tremors - + anxiety - + palpitations - for years, now worse - had 2DECHOs, Holter monitor - has PSVT, has mild MR - + problems with concentration - + occasional hyperdefecation  She has FH of thyroid ds (hypothyroidism in father). No FH of thyroid cancer. No h/o radiation tx to head or neck.  No seaweed or kelp, no recent contrast studies. No steroid use. No herbal supplements.   I reviewed her chart and she also has a history of paroxysmal SVT,  obesity.  ROS: Constitutional: see HPI , + nocturia Eyes: no blurry vision, no xerophthalmia - some pain when rubs R eye ENT: no sore throat, no nodules palpated in throat, no dysphagia/odynophagia, no hoarseness Cardiovascular: no CP/SOB/+ palpitations/no leg swelling Respiratory: no cough/SOB Gastrointestinal: no N/V/+ occas. D/no C Musculoskeletal: + muscle/+ joint aches Skin: + rash - small erythematous papules on dorsum of hands, + easy bruising Neurological: no tremors/numbness/tingling/dizziness Psychiatric: no depression/+anxiety  Past Medical History  Diagnosis Date  . Arthritis   . Chicken pox   . Allergy   . Hypertension   . Thyroid disease   . Irritable bowel syndrome (IBS) 2004  . Colitis 2004  . Colon polyps   . Palpitations 2000   Past Surgical History  Procedure Laterality Date  . Cholecystectomy  2001  . Tonsilectomy/adenoidectomy with myringotomy    . Cesarean section  2006,2010  . Tubal ligation  2010  . Carpal tunnel release Left 2012    Two cyst removal   History   Social History  . Marital Status: Married    Spouse Name: N/A    Number of Children: 2: 8 and 4 y/o   Occupational History  . Not on file.   Social History Main Topics  . Smoking status: Never Smoker   . Smokeless tobacco: Never Used  .  Alcohol Use: Yes  . Drug Use: No  . Sexual Activity: Yes   Current Outpatient Prescriptions on File Prior to Visit  Medication Sig Dispense Refill  . fexofenadine (ALLEGRA) 180 MG tablet Take 180 mg by mouth daily.      . fluticasone (FLONASE) 50 MCG/ACT nasal spray Place 2 sprays into the nose daily.      . hydrochlorothiazide (HYDRODIURIL) 25 MG tablet Take 25 mg by mouth daily.      . methimazole (TAPAZOLE) 5 MG tablet Take 1 tablet (5 mg total) by mouth 3 (three) times daily.  90 tablet  3  . methocarbamol (ROBAXIN) 750 MG tablet Take 1 tablet (750 mg total) by mouth 4 (four) times daily as needed.  90 tablet  3  . propranolol (INDERAL) 20  MG tablet Take 1 tablet (20 mg total) by mouth 3 (three) times daily. As needed for SVT  90 tablet  3   No current facility-administered medications on file prior to visit.   No Known Allergies Family History  Problem Relation Age of Onset  . Alcohol abuse Mother   . Arthritis Mother   . Hyperlipidemia Mother   . Hypertension Mother   . Arthritis Father   . Stroke Maternal Grandfather   . Heart disease Maternal Grandfather   . Heart disease Paternal Grandfather    PE: BP 124/68  Pulse 70  Temp(Src) 98.1 F (36.7 C) (Oral)  Resp 10  Ht 5\' 5"  (1.651 m)  Wt 191 lb 12.8 oz (87 kg)  BMI 31.92 kg/m2  SpO2 96%  LMP 10/27/2012 Wt Readings from Last 3 Encounters:  11/27/12 189 lb 8 oz (85.957 kg)  10/02/12 196 lb 12 oz (89.245 kg)   Constitutional: overweight, in NAD Eyes: PERRLA, EOMI, no exophthalmos, no lid lag, no stare ENT: moist mucous membranes, no thyromegaly, no thyroid bruits, no cervical lymphadenopathy Cardiovascular: RRR, No MRG Respiratory: CTA B Gastrointestinal: abdomen soft, NT, ND, BS+ Musculoskeletal: no deformities, strength intact in all 4 Skin: moist, warm, + rash - small erythematous papules on dorsum of hands Neurological: no tremor with outstretched hands, DTR normal in all 4  ASSESSMENT: 1. Graves ds.  PLAN:  1. Patient with recently found Graves ds, with thyrotoxic sxs: weight loss, anxiety, palpitations, heat intolerance. - She does not appear to have exogenous causes for the low TSH. Her low TSH, high fT4, elevated 24h uptake and homogeneous scan point towards a dx of Graves ds - we discussed about possible modalities of treatment for the above conditions, to include methimazole use, radioactive iodine ablation or surgery. In her case, due to her preexisting PSVT, I would recommend the definitive tx: RAI ablation - pt agrees. We discussed about the treatment and I will order it to be done at [facility]. We also discussed about possible outcomes, she might  b/c hypothyroid and need thyroid hormones. - she is on a beta blocker and we will continue this and the MMI - which needs to be stopped 4 days prior to the RAI tx - RTC in 2 months

## 2012-12-10 NOTE — Telephone Encounter (Signed)
Carollee Herter, can we get this from [place]? I only have the mention of the report in PCP's note. She told me she had it done in the [place] dept near [facility], I am not sure if it is part of [facility].Marland KitchenMarland Kitchen

## 2012-12-11 NOTE — Telephone Encounter (Signed)
Faxed a copy of uptake and scan to Jan at [facility], #[phone].

## 2012-12-14 ENCOUNTER — Encounter: Payer: Self-pay | Admitting: Internal Medicine

## 2012-12-18 ENCOUNTER — Encounter (HOSPITAL_COMMUNITY)
Admission: RE | Admit: 2012-12-18 | Discharge: 2012-12-18 | Disposition: A | Discharge status: Home / Self Care | Payer: 59 | Source: Ambulatory Visit | Attending: Internal Medicine | Admitting: Internal Medicine

## 2012-12-18 DIAGNOSIS — E0590 Thyrotoxicosis, unspecified without thyrotoxic crisis or storm: Secondary | ICD-10-CM | POA: Insufficient documentation

## 2012-12-18 MED ORDER — SODIUM IODIDE I 131 CAPSULE
16.0000 | Freq: Once | INTRAVENOUS | Status: AC | PRN
  Administered 2012-12-18: 16 via ORAL

## 2012-12-23 ENCOUNTER — Encounter: Payer: 59 | Admitting: Internal Medicine

## 2013-01-14 LAB — HM PAP SMEAR: HM PAP: NEGATIVE

## 2013-01-29 ENCOUNTER — Other Ambulatory Visit (HOSPITAL_COMMUNITY)
Admission: RE | Admit: 2013-01-29 | Discharge: 2013-01-29 | Disposition: A | Discharge status: Home / Self Care | Payer: 59 | Source: Ambulatory Visit | Attending: Internal Medicine | Admitting: Internal Medicine

## 2013-01-29 ENCOUNTER — Encounter: Payer: Self-pay | Admitting: Internal Medicine

## 2013-01-29 ENCOUNTER — Ambulatory Visit (INDEPENDENT_AMBULATORY_CARE_PROVIDER_SITE_OTHER): Payer: 59 | Admitting: Internal Medicine

## 2013-01-29 VITALS — BP 124/76 | HR 63 | Temp 98.0°F | Resp 18 | Ht 64.75 in | Wt 194.2 lb

## 2013-01-29 DIAGNOSIS — Z1151 Encounter for screening for human papillomavirus (HPV): Secondary | ICD-10-CM | POA: Insufficient documentation

## 2013-01-29 DIAGNOSIS — R609 Edema, unspecified: Secondary | ICD-10-CM

## 2013-01-29 DIAGNOSIS — Z124 Encounter for screening for malignant neoplasm of cervix: Secondary | ICD-10-CM | POA: Insufficient documentation

## 2013-01-29 DIAGNOSIS — E0500 Thyrotoxicosis with diffuse goiter without thyrotoxic crisis or storm: Secondary | ICD-10-CM

## 2013-01-29 DIAGNOSIS — E669 Obesity, unspecified: Secondary | ICD-10-CM

## 2013-01-29 DIAGNOSIS — I471 Supraventricular tachycardia: Secondary | ICD-10-CM

## 2013-01-29 DIAGNOSIS — Z79899 Other long term (current) drug therapy: Secondary | ICD-10-CM

## 2013-01-29 DIAGNOSIS — Z0000 Encounter for general adult medical examination without abnormal findings: Secondary | ICD-10-CM

## 2013-01-29 MED ORDER — FUROSEMIDE 20 MG PO TABS: 20.0000 mg | ORAL_TABLET | Freq: Every day | ORAL | Status: DC

## 2013-01-29 NOTE — Patient Instructions (Signed)
We are going to change your chtz to lasix for increased edema.  Return for BMET end of February

## 2013-01-29 NOTE — Progress Notes (Signed)
Patient ID: [name], female   DOB: 06/01/1975, 38 y.o.   MRN: [id number]   Subjective:     [name] is a 38 y.o. female and is here for a comprehensive physical exam. The patient reports problems - The joint pain and palpitations..  History   Social History  . Marital Status: Married    Spouse Name: N/A    Number of Children: N/A  . Years of Education: N/A   Occupational History  . Not on file.   Social History Main Topics  . Smoking status: Never Smoker   . Smokeless tobacco: Never Used  . Alcohol Use: Yes  . Drug Use: No  . Sexual Activity: Yes    Partners: Male   Other Topics Concern  . Not on file   Social History Narrative   Married: 2 kids: ages 698 and 4.5 yrs   Work: [employer]       Regular exercise: not at this time   Caffeine use: coffee in the AM: dt soda at night               Health Maintenance  Topic Date Due  . Influenza Vaccine  08/07/2013  . Tetanus/tdap  10/04/2015  . Pap Smear  01/30/2016    The following portions of the patient's history were reviewed and updated as appropriate: allergies, current medications, past family history, past medical history, past social history, past surgical history and problem list.  Review of Systems A comprehensive review of systems was negative.   Objective:   General Appearance:    Alert, cooperative, no distress, appears stated age  Head:    Normocephalic, without obvious abnormality, atraumatic  Eyes:    PERRL, conjunctiva/corneas clear, EOM's intact, fundi    benign, both eyes mild exophthalmos   Ears:    Normal TM's and external ear canals, both ears  Nose:   Nares normal, septum midline, mucosa normal, no drainage    or sinus tenderness  Throat:   Lips, mucosa, and tongue normal; teeth and gums normal  Neck:   Supple, symmetrical, trachea midline, no adenopathy;    thyroid:  no enlargement/tenderness/nodules; no carotid   bruit or JVD  Back:     Symmetric, no curvature, ROM normal, no  CVA tenderness  Lungs:     Clear to auscultation bilaterally, respirations unlabored  Chest Wall:    No tenderness or deformity   Heart:    Regular rate and rhythm, S1 and S2 normal, no murmur, rub   or gallop  Breast Exam:    No tenderness, masses, or nipple abnormality  Abdomen:     Soft, non-tender, bowel sounds active all four quadrants,    no masses, no organomegaly  Genitalia:    Pelvic: cervix normal in appearance, external genitalia normal, no adnexal masses or tenderness, no cervical motion tenderness, rectovaginal septum normal, uterus normal size, shape, and consistency and vagina normal without discharge  Extremities:   Extremities normal, atraumatic, no cyanosis or edema  Pulses:   2+ and symmetric all extremities  Skin:   Skin color, texture, turgor normal, no rashes or lesions  Lymph nodes:   Cervical, supraclavicular, and axillary nodes normal  Neurologic:   CNII-XII intact, normal strength, sensation and reflexes    throughout     Assessment and Plan:   Edema Change to lasix for increased edema.  Return for BMET end of February   Encounter for preventive health examination Annual comprehensive exam was done  including breast, pelvic and PAP smear. All screenings have been addressed .   Graves disease Concurrent with antibody testing. Patient is status post treatment with methimazole followed by radioablation. Recent TSH was normal. She will follow up with Dr. Weyman Pedro next month.  Obesity, unspecified I have addressed  BMI and recommended a low glycemic index diet utilizing smaller more frequent meals to increase metabolism.  I have also recommended that patient start exercising with a goal of 30 minutes of aerobic exercise a minimum of 5 days per week. Screening for lipid disorders, thyroid and diabetes has been done.  Paroxysmal SVT (supraventricular tachycardia) Secondary to Graves' disease. Her palpitations have largely resolved. Continue when necessary  propanolol.   Updated Medication List Outpatient Encounter Prescriptions as of 01/29/2013  Medication Sig  . fexofenadine (ALLEGRA) 180 MG tablet Take 180 mg by mouth daily.  . fluticasone (FLONASE) 50 MCG/ACT nasal spray Place 2 sprays into the nose daily.  . methocarbamol (ROBAXIN) 750 MG tablet Take 1 tablet (750 mg total) by mouth 4 (four) times daily as needed.  . propranolol (INDERAL) 20 MG tablet Take 1 tablet (20 mg total) by mouth 3 (three) times daily. As needed for SVT  . [DISCONTINUED] hydrochlorothiazide (HYDRODIURIL) 25 MG tablet Take 25 mg by mouth daily.  . furosemide (LASIX) 20 MG tablet Take 1 tablet (20 mg total) by mouth daily.  . methimazole (TAPAZOLE) 5 MG tablet Take 1 tablet (5 mg total) by mouth 3 (three) times daily.

## 2013-01-29 NOTE — Assessment & Plan Note (Signed)
Change to lasix for increased edema.  Return for BMET end of February    

## 2013-01-29 NOTE — Progress Notes (Signed)
Pre-visit discussion using our clinic review tool. No additional management support is needed unless otherwise documented below in the visit note.  

## 2013-01-31 DIAGNOSIS — Z0000 Encounter for general adult medical examination without abnormal findings: Secondary | ICD-10-CM | POA: Insufficient documentation

## 2013-01-31 NOTE — Assessment & Plan Note (Signed)
Secondary to Graves' disease. Her palpitations have largely resolved. Continue when necessary propanolol.

## 2013-01-31 NOTE — Assessment & Plan Note (Signed)
Annual comprehensive exam was done including breast, pelvic and PAP smear. All screenings have been addressed .  

## 2013-01-31 NOTE — Assessment & Plan Note (Signed)
Concurrent with antibody testing. Patient is status post treatment with methimazole followed by radioablation. Recent TSH was normal. She will follow up with Dr. Weyman PedroGheege next month.

## 2013-01-31 NOTE — Assessment & Plan Note (Signed)
I have addressed  BMI and recommended a low glycemic index diet utilizing smaller more frequent meals to increase metabolism.  I have also recommended that patient start exercising with a goal of 30 minutes of aerobic exercise a minimum of 5 days per week. Screening for lipid disorders, thyroid and diabetes has  been done  °

## 2013-02-03 ENCOUNTER — Encounter: Payer: Self-pay | Admitting: Internal Medicine

## 2013-02-03 ENCOUNTER — Ambulatory Visit (INDEPENDENT_AMBULATORY_CARE_PROVIDER_SITE_OTHER): Payer: 59 | Admitting: Internal Medicine

## 2013-02-03 VITALS — BP 106/68 | HR 46 | Temp 97.4°F | Resp 12 | Wt 198.0 lb

## 2013-02-03 DIAGNOSIS — E032 Hypothyroidism due to medicaments and other exogenous substances: Secondary | ICD-10-CM

## 2013-02-03 DIAGNOSIS — E0500 Thyrotoxicosis with diffuse goiter without thyrotoxic crisis or storm: Secondary | ICD-10-CM

## 2013-02-03 LAB — T4, FREE: Free T4: 0.29 ng/dL — ABNORMAL LOW (ref 0.60–1.60)

## 2013-02-03 LAB — TSH: TSH: 29.63 u[IU]/mL — ABNORMAL HIGH (ref 0.35–5.50)

## 2013-02-03 LAB — T3, FREE: T3 FREE: 1.9 pg/mL — AB (ref 2.3–4.2)

## 2013-02-03 NOTE — Progress Notes (Signed)
Patient ID: Tracy MercuryKori L Higgins, female   DOB: 03/19/1975, 38 y.o.   MRN: [id number]   HPI  Tracy MercuryKori L Ebanks is a 38 y.o.-year-old female, returning for f/u for Graves ds.  Reviewed hx: Pt has been initially dx with hypothyroidism and was on Levothyroxine for 1.5 years by Dr Milinda CaveJadaly - stopped in 09/2012 (she was on 88 mcg then).  I reviewed pt's thyroid tests: Lab Results  Component Value Date   TSH 0.005* 11/27/2012   TSH 0.005* 11/16/2012   TSH 0.010* 10/02/2012   FREET4 2.98* 11/27/2012   FREET4 2.36* 11/16/2012   FREET4 3.22* 10/02/2012    Pt had high anti-TPO (180, nl <35)  and anti-thyroglobulin antibodies (61.9, nl <40) (no TSI checked).  She has a thyroid U/S on 08/24/2012 at [facility]: - heterogeneous gland, no nodules  She had a thyroid U/S on 11/23/2012 (I do not have the report): - 3 subcm nodules  She also had a thyroid Uptake and scan on 11/25/2012:  - 24-h uptake was high, at 56.2%, and the scan was homogeneous, c/w Graves ds.  She was started on MMI 5 mg tid on 11/27/2012 and Inderal 20 mg tid by PCP in 09/2012.  We treated her with RAI (16 mCi) on 12/18/2012.  Pt denies feeling nodules in neck, hoarseness, dysphagia/odynophagia, SOB with lying down.  She c/o: - less fatigue - improved irritability - less anxiety - + cold intolerance - lately weight gain 7 lbs (at last visit weight loss - lost 20 lbs in 2.4 mo)  - no tremors - less anxiety - still palpitations - for years, now worse - had 2DECHOs, Holter monitor - has PSVT, has mild MR - still joints pain  I reviewed pt's medications, allergies, PMH, social hx, family hx and no changes required, except as mentioned above. Started Lasix prn for hand and leg swelling.  ROS: Constitutional: see HPI Eyes: no blurry vision, no xerophthalmia - some pain when rubs R eye ENT: no sore throat, no nodules palpated in throat, no dysphagia/odynophagia, no hoarseness Cardiovascular: no CP/SOB/+ palpitations/+ leg  swelling Respiratory: no cough/SOB Gastrointestinal: no N/V/D/no C Musculoskeletal: + muscle/+ joint aches Skin: no rash Neurological: no tremors/numbness/tingling/dizziness  PE: BP 106/68  Pulse 46  Temp(Src) 97.4 F (36.3 C) (Oral)  Resp 12  Wt 198 lb (89.812 kg)  SpO2 99%  LMP 01/23/2013 Wt Readings from Last 3 Encounters:  02/03/13 198 lb (89.812 kg)  01/29/13 194 lb 4 oz (88.111 kg)  12/09/12 191 lb 12.8 oz (87 kg)   Constitutional: overweight, in NAD Eyes: PERRLA, EOMI, no exophthalmos, no lid lag, no stare ENT: moist mucous membranes, no thyromegaly, no thyroid bruits, no cervical lymphadenopathy Cardiovascular: RRR, No MRG Respiratory: CTA B Gastrointestinal: abdomen soft, NT, ND, BS+ Musculoskeletal: no deformities, strength intact in all 4 Skin: moist, warm, + rash - small erythematous papules on dorsum of hands Neurological: no tremor with outstretched hands, DTR normal in all 4  ASSESSMENT: 1. Graves ds.  PLAN:  1. Patient with Graves ds, with thyrotoxic sxs: weight loss, anxiety, palpitations, heat intolerance; now improved after RAI tx 1.5 mo ago. - check TFTs today - she is on a beta blocker and we will continue this but her pulse is 46 today >> since she still has palpitations, will advise her to reduce the dose to bid and start Mg supplement. She will let me know if she developed more palpitations or tachycardia after decreasing the Propranolol dose - RTC in 4 months  Office Visit on 02/03/2013  Component Date Value Range Status  . TSH 02/03/2013 29.63* 0.35 - 5.50 uIU/mL Final  . Free T4 02/03/2013 0.29* 0.60 - 1.60 ng/dL Final  . T3, Free 16/10/9602 1.9* 2.3 - 4.2 pg/mL Final   Start 50 mcg Synthroid >> RTC in 5 weeks for repeat labs >> likely will need to increase dose then

## 2013-02-03 NOTE — Patient Instructions (Signed)
Please stop at the lab. Decrease the Propranolol to 2x a day for few days and see if the palpitations return. Can try Magnesium (over the counter) 400 or 500 mg daily to see if it helps with palpitations. Please come back for a follow-up appointment in 4 months.

## 2013-02-04 ENCOUNTER — Encounter: Payer: Self-pay | Admitting: Internal Medicine

## 2013-02-04 ENCOUNTER — Telehealth: Payer: Self-pay | Admitting: Internal Medicine

## 2013-02-04 DIAGNOSIS — E032 Hypothyroidism due to medicaments and other exogenous substances: Secondary | ICD-10-CM | POA: Insufficient documentation

## 2013-02-04 DIAGNOSIS — R87610 Atypical squamous cells of undetermined significance on cytologic smear of cervix (ASC-US): Secondary | ICD-10-CM | POA: Insufficient documentation

## 2013-02-04 MED ORDER — LEVOTHYROXINE SODIUM 50 MCG PO TABS: 50.0000 ug | ORAL_TABLET | Freq: Every day | ORAL | Status: DC

## 2013-02-04 NOTE — Telephone Encounter (Signed)
Printed info for Dr. Darrick Huntsmanullo to review

## 2013-02-04 NOTE — Telephone Encounter (Signed)
I spoke with husband & informed him that she needs to go to the ED for treatment. Husbands states that he will [facility] today.

## 2013-02-04 NOTE — Telephone Encounter (Signed)
If she is feeling dizzy or faint, she needs to go to the ER, because there is nothing i can do for her here except an EKG which will not help.  I have no medications to give her to spped up her heart rate if it is too low.  The prpanaolol is probably stilll in her system and causing the problem

## 2013-02-04 NOTE — Telephone Encounter (Signed)
Patient Information:  Caller Name: Eli PhillipsKori  Phone: [phone]  Patient: [name], [name]  Gender: Female  DOB: 03/24/1975  Age: 38 Years  PCP: Duncan Dullullo, Teresa (Adults only)  Pregnant: No  Office Follow Up:  Does the office need to follow up with this patient?: Yes  Instructions For The Office: Please contact patient. ED disposition . HR 46 , low blood pressure.  Request appt.  RN Note:  Patient states that Dr. Elver[facility] and will not have call back until 1-2 pm.  PLEASE CONTACT FOR ED DISPOSITION. patient wants to be seen in office. PLEASE CONTACT.  DID NOT TAKE propanolol or lasix  Symptoms  Reason For Call & Symptoms: Patient was at Endocrinologist yesterday follow up with Graves disease treatment. She was found to have  HR 46 and blood pressure low and they decreased her propanolol.  Pulse ox 99 %.  She did not take the propanolol last night but her heartrate  is still 40.  she contacted Dr. Darrick Huntsmanullo office this morning and was referred to Endocrinology. The Endo can not see her today, [facility] and will not get a phone call back until 1-2pm  (TSH level noted to 29.63) Graves disease with iodine radiation done in 12/2012.  Reviewed Health History In EMR: Yes  Reviewed Medications In EMR: Yes  Reviewed Allergies In EMR: Yes  Reviewed Surgeries / Procedures: Yes  Date of Onset of Symptoms: 02/02/2013  Treatments Tried: stopped propanlol  Treatments Tried Worked: No OB / GYN:  LMP: Unknown  Guideline(s) Used:  Dizziness  Weakness (Generalized) and Fatigue  Disposition Per Guideline:   Go to ED Now  Reason For Disposition Reached:   Heart beating < 50 beats per minute OR > 140 beats per minute  Advice Given:  Call Back If:  Unable to stand or walk  Passes out  Breathing difficulty occurs  You become worse.  RN Overrode Recommendation:  Document Patient  Please contact patient. ED disposition . HR 46 , low blood pressure.  Request  appt.

## 2013-02-04 NOTE — Telephone Encounter (Signed)
Patient Information:  Caller Name: [name]  Phone: [phone]  Patient: [name], [name]  Gender: Female  DOB: 05/21/1975  Age: 38 Years  PCP: Duncan Dullullo, Teresa (Adults only)  Pregnant: No  Office Follow Up:  Does the office need to follow up with this patient?: No  Instructions For The Office: N/A  RN Note:  Epic chart reviewed.  Pt labs from 1/28 reviewed and found to be abnormally low.  Pt advised to call Endo office back at this time to discuss sxs and review labs.  Pt advised to call back to office if there are sxs that still need to be addressed with pt or concerns.  Pt agreed and states that she will call now.  Symptoms  Reason For Call & Symptoms: Pt was seen in the offfice of Endocrinologist on 1/29.  While there she was found to have a pulse around 40bpm.  Pt was advised to drceases propanolol.  Pt states that she does not feel well and is very tired.  She has multiple complaints.  Pt reports that she did not take any of her home medications last night or this am bc of concern about low HR.  Reviewed Health History In EMR: Yes  Reviewed Medications In EMR: Yes  Reviewed Allergies In EMR: Yes  Reviewed Surgeries / Procedures: Yes  Date of Onset of Symptoms: 02/04/2013 OB / GYN:  LMP: Unknown  Guideline(s) Used:  No Protocol Available - Sick Adult  Disposition Per Guideline:   See Within 2 Weeks in Office  Reason For Disposition Reached:   Nursing judgment  Advice Given:  N/A  RN Overrode Recommendation:  Follow Up With Office Later  Pt will call Endo office and then f/u as needed

## 2013-02-04 NOTE — Telephone Encounter (Signed)
FYI

## 2013-02-09 ENCOUNTER — Telehealth: Payer: Self-pay | Admitting: *Deleted

## 2013-02-09 NOTE — Telephone Encounter (Signed)
Yes, I received the D/C summary - that is OK -I would like to see her for another visit in ~5 weeks.

## 2013-02-09 NOTE — Telephone Encounter (Signed)
Pt called stating that she has been in the hospital (we received records) and they advised her to schedule a follow up appt [facility]. Advised the pt that I would check the schedule at [facility] and call her with that appt. Pt stated that she saw her cardiologist yesterday and there is no change and she has been on the 137 mcg of synthroid for 6 days. Advised pt I would let Dr Elvera LennoxGherghe know.

## 2013-02-10 ENCOUNTER — Ambulatory Visit: Payer: 59 | Admitting: Internal Medicine

## 2013-02-10 NOTE — Telephone Encounter (Signed)
Spoke with Asher MuirJamie from [facility] office and she is going to call pt and schedule appt for 4 weeks from today.

## 2013-02-25 ENCOUNTER — Other Ambulatory Visit: Payer: 59

## 2013-02-25 ENCOUNTER — Other Ambulatory Visit (INDEPENDENT_AMBULATORY_CARE_PROVIDER_SITE_OTHER): Payer: 59

## 2013-02-25 DIAGNOSIS — E032 Hypothyroidism due to medicaments and other exogenous substances: Secondary | ICD-10-CM

## 2013-02-25 DIAGNOSIS — Z79899 Other long term (current) drug therapy: Secondary | ICD-10-CM

## 2013-02-25 LAB — BASIC METABOLIC PANEL
BUN: 11 mg/dL (ref 6–23)
CHLORIDE: 102 meq/L (ref 96–112)
CO2: 28 meq/L (ref 19–32)
CREATININE: 0.8 mg/dL (ref 0.4–1.2)
Calcium: 9.4 mg/dL (ref 8.4–10.5)
GFR: 89.47 mL/min (ref >60.00)
Glucose, Bld: 71 mg/dL (ref 70–99)
Potassium: 4 mEq/L (ref 3.5–5.1)
Sodium: 137 mEq/L (ref 135–145)

## 2013-02-25 LAB — T4, FREE: Free T4: 0.92 ng/dL (ref 0.60–1.60)

## 2013-02-25 LAB — TSH: TSH: 26.71 u[IU]/mL — ABNORMAL HIGH (ref 0.35–5.50)

## 2013-02-25 NOTE — Addendum Note (Signed)
Addended by: Montine CircleMALDONADO, Ernesta Trabert D on: 02/25/2013 10:02 AM   Modules accepted: Orders

## 2013-02-26 ENCOUNTER — Other Ambulatory Visit: Payer: 59

## 2013-02-26 ENCOUNTER — Other Ambulatory Visit: Payer: Self-pay | Admitting: Internal Medicine

## 2013-02-26 DIAGNOSIS — E032 Hypothyroidism due to medicaments and other exogenous substances: Secondary | ICD-10-CM

## 2013-02-26 MED ORDER — LEVOTHYROXINE SODIUM 150 MCG PO TABS: 150.0000 ug | ORAL_TABLET | Freq: Every day | ORAL | Status: DC

## 2013-02-28 ENCOUNTER — Encounter: Payer: Self-pay | Admitting: Internal Medicine

## 2013-02-28 ENCOUNTER — Other Ambulatory Visit: Payer: Self-pay | Admitting: Internal Medicine

## 2013-02-28 MED ORDER — LEVOTHYROXINE SODIUM 175 MCG PO TABS: 175.0000 ug | ORAL_TABLET | Freq: Every day | ORAL | Status: DC

## 2013-03-22 ENCOUNTER — Other Ambulatory Visit (INDEPENDENT_AMBULATORY_CARE_PROVIDER_SITE_OTHER): Payer: 59

## 2013-03-22 DIAGNOSIS — E032 Hypothyroidism due to medicaments and other exogenous substances: Secondary | ICD-10-CM

## 2013-03-23 ENCOUNTER — Other Ambulatory Visit: Payer: Self-pay | Admitting: Internal Medicine

## 2013-03-23 DIAGNOSIS — E032 Hypothyroidism due to medicaments and other exogenous substances: Secondary | ICD-10-CM

## 2013-03-23 LAB — T4, FREE: Free T4: 0.87 ng/dL (ref 0.60–1.60)

## 2013-03-23 LAB — TSH: TSH: 10.67 u[IU]/mL — ABNORMAL HIGH (ref 0.35–5.50)

## 2013-03-23 MED ORDER — LEVOTHYROXINE SODIUM 200 MCG PO TABS: 200.0000 ug | ORAL_TABLET | Freq: Every day | ORAL | Status: DC

## 2013-03-24 ENCOUNTER — Ambulatory Visit: Payer: 59 | Admitting: Internal Medicine

## 2013-03-24 ENCOUNTER — Telehealth: Payer: Self-pay

## 2013-03-24 NOTE — Telephone Encounter (Signed)
Pt left v/m; pt has been seeing Dr Elvera LennoxGherghe for hypothyroidism; pt's dose increased but pt having a lot of joint and muscle pain; pt taking ibuprofen round the clock for weeks and pt wants to know what else can be done to help joint pain. [facility].

## 2013-03-24 NOTE — Telephone Encounter (Signed)
Called pt and lvm advising her that we are unsure why she had to cancel her appt today. Dr Elvera LennoxGherghe is unsure what may be the cause of her joint and muscle pain. She advised that she check with her PCP to discuss. Advised her that Dr Elvera LennoxGherghe increased her dose of Synthroid based on her latest labwork, the higher dose was sent to her pharmacy yesterday.

## 2013-03-24 NOTE — Telephone Encounter (Signed)
She had an appt today, unclear why she cancelled if she wanted us to discuss her sxs...  I will advise to check with PCP.  Dose of Synthroid increased based on latest labwork >> higher dose sent yesterday. I am not sure what else we can do for her hypothyroidism.

## 2013-03-24 NOTE — Telephone Encounter (Signed)
Please read note below and advise.  

## 2013-04-28 ENCOUNTER — Telehealth: Payer: Self-pay | Admitting: Internal Medicine

## 2013-04-28 DIAGNOSIS — Z79899 Other long term (current) drug therapy: Secondary | ICD-10-CM

## 2013-04-28 DIAGNOSIS — E039 Hypothyroidism, unspecified: Secondary | ICD-10-CM

## 2013-04-28 DIAGNOSIS — E785 Hyperlipidemia, unspecified: Secondary | ICD-10-CM

## 2013-04-28 NOTE — Telephone Encounter (Signed)
Patient has an appointment 05/04/13 please advise patient would like to know if she should have TSH done here prior to appointment.

## 2013-04-29 NOTE — Telephone Encounter (Signed)
Yes, we changed her thyroid dose in late feburary so a repeat TSH would be due now. I will order. WE have not done fasting lipids  Ever, so if she wants to do fastings, I will order them too,  Dr Lafe GarinGherge has also  ordered the thyroid tests so she will see the ones I ordered

## 2013-04-30 ENCOUNTER — Telehealth: Payer: Self-pay | Admitting: Internal Medicine

## 2013-04-30 ENCOUNTER — Other Ambulatory Visit (INDEPENDENT_AMBULATORY_CARE_PROVIDER_SITE_OTHER): Payer: 59

## 2013-04-30 DIAGNOSIS — E039 Hypothyroidism, unspecified: Secondary | ICD-10-CM

## 2013-04-30 DIAGNOSIS — Z79899 Other long term (current) drug therapy: Secondary | ICD-10-CM

## 2013-04-30 LAB — CBC WITH DIFFERENTIAL/PLATELET
Basophils Absolute: 0 10*3/uL (ref 0.0–0.1)
Basophils Relative: 0 % (ref 0–1)
EOS PCT: 3 % (ref 0–5)
Eosinophils Absolute: 0.2 10*3/uL (ref 0.0–0.7)
HCT: 35.3 % — ABNORMAL LOW (ref 36.0–46.0)
Hemoglobin: 12.4 g/dL (ref 12.0–15.0)
LYMPHS PCT: 17 % (ref 12–46)
Lymphs Abs: 1 10*3/uL (ref 0.7–4.0)
MCH: 30.8 pg (ref 26.0–34.0)
MCHC: 35.1 g/dL (ref 30.0–36.0)
MCV: 87.6 fL (ref 78.0–100.0)
Monocytes Absolute: 0.3 10*3/uL (ref 0.1–1.0)
Monocytes Relative: 5 % (ref 3–12)
NEUTROS PCT: 75 % (ref 43–77)
Neutro Abs: 4.4 10*3/uL (ref 1.7–7.7)
Platelets: 198 10*3/uL (ref 150–400)
RBC: 4.03 MIL/uL (ref 3.87–5.11)
RDW: 13.7 % (ref 11.5–15.5)
WBC: 5.8 10*3/uL (ref 4.0–10.5)

## 2013-04-30 NOTE — Telephone Encounter (Signed)
Detailed message left on voicemail for patient to call office and schedule lab appointment.

## 2013-04-30 NOTE — Addendum Note (Signed)
Addended by: Montine CircleMALDONADO, Alaynna Kerwood D on: 04/30/2013 03:52 PM   Modules accepted: Orders

## 2013-04-30 NOTE — Telephone Encounter (Signed)
error 

## 2013-05-01 LAB — LIPID PANEL
Cholesterol: 136 mg/dL (ref 0–200)
HDL: 48 mg/dL (ref >39)
LDL CALC: 74 mg/dL (ref 0–99)
Total CHOL/HDL Ratio: 2.8 Ratio
Triglycerides: 69 mg/dL (ref <150)
VLDL: 14 mg/dL (ref 0–40)

## 2013-05-01 LAB — COMPREHENSIVE METABOLIC PANEL
ALT: 9 U/L (ref 0–35)
AST: 12 U/L (ref 0–37)
Albumin: 4.2 g/dL (ref 3.5–5.2)
Alkaline Phosphatase: 43 U/L (ref 39–117)
BILIRUBIN TOTAL: 0.9 mg/dL (ref 0.2–1.2)
BUN: 12 mg/dL (ref 6–23)
CO2: 31 mEq/L (ref 19–32)
CREATININE: 0.66 mg/dL (ref 0.50–1.10)
Calcium: 8.6 mg/dL (ref 8.4–10.5)
Chloride: 102 mEq/L (ref 96–112)
Glucose, Bld: 99 mg/dL (ref 70–99)
Potassium: 3.4 mEq/L — ABNORMAL LOW (ref 3.5–5.3)
Sodium: 141 mEq/L (ref 135–145)
Total Protein: 6.2 g/dL (ref 6.0–8.3)

## 2013-05-01 LAB — T4, FREE: Free T4: 1.43 ng/dL (ref 0.80–1.80)

## 2013-05-01 LAB — TSH: TSH: 8.956 u[IU]/mL — AB (ref 0.350–4.500)

## 2013-05-04 ENCOUNTER — Ambulatory Visit (INDEPENDENT_AMBULATORY_CARE_PROVIDER_SITE_OTHER): Payer: 59 | Admitting: Internal Medicine

## 2013-05-04 ENCOUNTER — Encounter: Payer: Self-pay | Admitting: Internal Medicine

## 2013-05-04 VITALS — BP 120/78 | HR 68 | Temp 98.1°F | Resp 16 | Wt 209.5 lb

## 2013-05-04 DIAGNOSIS — E669 Obesity, unspecified: Secondary | ICD-10-CM

## 2013-05-04 DIAGNOSIS — E032 Hypothyroidism due to medicaments and other exogenous substances: Secondary | ICD-10-CM

## 2013-05-04 MED ORDER — LEVOTHYROXINE SODIUM 200 MCG PO TABS: 200.0000 ug | ORAL_TABLET | Freq: Every day | ORAL | Status: DC

## 2013-05-04 MED ORDER — LEVOTHYROXINE SODIUM 25 MCG PO TABS: ORAL_TABLET | ORAL | Status: DC

## 2013-05-04 NOTE — Patient Instructions (Signed)
I am increasing your total thyroid medication daily dose to 225 mcg daily .  We should repeat your TSH in 6 weeks again. Swimming will help you lose weight bc of the effect on your heart rate and the aerobic benefit    This is my version of a  "Low GI"  Diet:  It will still lower your blood sugars and allow you to lose 4 to 8  lbs  per month if you follow it carefully.  Your goal with exercise is a minimum of 30 minutes of aerobic exercise 5 days per week (Walking does not count once it becomes easy!)    All of the foods can be found at grocery stores and in bulk at [employer].  The Atkins protein bars and shakes are available in more varieties at Target, WalMart and Lowe's Foods.     7 AM Breakfast:  Choose from the following:  Low carbohydrate Protein  Shakes (I recommend the EAS AdvantEdge "Carb Control" shakes  Or the low carb shakes by Atkins.    2.5 carbs   Arnold's "Sandwhich Thin"toasted  w/ peanut butter (no jelly: about 20 net carbs  "Bagel Thin" with cream cheese and salmon: about 20 carbs   a scrambled egg/bacon/cheese burrito made with Mission's "carb balance" whole wheat tortilla  (about 10 net carbs )   Avoid cereal and bananas, oatmeal and cream of wheat and grits. They are loaded with carbohydrates!   10 AM: high protein snack  Protein bar by Atkins (the snack size, under 200 cal, usually < 6 net carbs).    A stick of cheese:  Around 1 carb,  100 cal     Dannon Light n Fit [place] Yogurt  (80 cal, 8 carbs)  Other so called "protein bars" and Greek yogurts tend to be loaded with carbohydrates.  Remember, in food advertising, the word "energy" is synonymous for " carbohydrate."  Lunch:   A Sandwich using the bread choices listed, Can use any  Eggs,  lunchmeat, grilled meat or canned tuna), avocado, regular mayo/mustard  and cheese.  A Salad using blue cheese, ranch,  Goddess or vinagrette,  No croutons or "confetti" and no "candied nuts" but regular nuts OK.   No pretzels or  chips.  Pickles and miniature sweet peppers are a good low carb alternative that provide a "crunch"  The bread is the only source of carbohydrate in a sandwich and  can be decreased by trying some of these alternatives to traditional loaf bread  Joseph's makes a pita bread and a flat bread that are 50 cal and 4 net carbs available at BJs and WalMart.  This can be toasted to use with hummous as well  Toufayan makes a low carb flatbread that's 100 cal and 9 net carbs available at [employer] and [employer]  Mission makes 2 sizes of  Low carb whole wheat tortilla  (The large one is 210 cal and 6 net carbs) Avoid "Low fat dressings, as well as Reyne Dum[place] and [address] dressings They are loaded with sugar!   3 PM/ Mid day  Snack:  Consider  1 ounce of  almonds, walnuts, pistachios, pecans, peanuts,  Macadamia nuts or a nut medley.  Avoid "granola"; the dried cranberries and raisins are loaded with carbohydrates. Mixed nuts as long as there are no raisins,  cranberries or dried fruit.     6 PM  Dinner:     Meat/fowl/fish with a green salad, and either broccoli, cauliflower,  green beans, spinach, brussel sprouts or  Lima beans. DO NOT BREAD THE PROTEIN!!      There is a low carb pasta by Dreamfield's that is acceptable and tastes great: only 5 digestible carbs/serving.( All grocery stores but BJs carry it )  Try Hurley Cisco Angelo's chicken piccata or chicken or eggplant parm over low carb pasta.(Lowes and BJs)   Marjory Lies Sanchez's "Carnitas" (pulled pork, no sauce,  0 carbs) or his beef pot roast to make a dinner burrito (at BJ's)  Pesto over low carb pasta (bj's sells a good quality pesto in the center refrigerated section of the deli   Whole wheat pasta is still full of digestible carbs and  Not as low in glycemic index as Dreamfield's.   Brown rice is still rice,  So skip the rice and noodles if you eat [place] or [place] (or at least limit to 1/2 cup)  9 PM snack :   Breyer's "low carb" fudgsicle or  ice  cream bar (Carb Smart line), or  Weight Watcher's ice cream bar , or another "no sugar added" ice cream;  a serving of fresh berries/cherries with whipped cream   Cheese or DANNON'S LlG[place] YOGURT  Avoid bananas, pineapple, grapes  and watermelon on a regular basis because they are high in sugar.  THINK OF THEM AS DESSERT  Remember that snack Substitutions should be less than 10 NET carbs per serving and meals < 20 carbs. Remember to subtract fiber grams to get the "net carbs."

## 2013-05-04 NOTE — Progress Notes (Signed)
Pre-visit discussion using our clinic review tool. No additional management support is needed unless otherwise documented below in the visit note.  

## 2013-05-05 ENCOUNTER — Encounter: Payer: Self-pay | Admitting: Internal Medicine

## 2013-05-05 NOTE — Assessment & Plan Note (Signed)
I have addressed  BMI and recommended a low glycemic index diet utilizing smaller more frequent meals to increase metabolism.  I have also recommended that patient start exercising with a goal of 30 minutes of aerobic exercise a minimum of 5 days per week. Screening for lipid disorders, thyroid and diabetes to be done today.   

## 2013-05-05 NOTE — Progress Notes (Signed)
Patient ID: Tracy MercuryKori L Severe, female   DOB: 12/03/1975, 38 y.o.   MRN: [id number]  Patient Active Problem List   Diagnosis Date Noted  . Atypical squamous cells of undetermined significance (ASCUS) on Papanicolaou smear of cervix 02/04/2013  . Iatrogenic hypothyroidism 02/04/2013  . Encounter for preventive health examination 01/31/2013  . Graves disease 11/26/2012  . Abnormal mammogram 10/14/2012  . Obesity, unspecified 10/03/2012  . Colitis, nonspecific 10/03/2012  . Edema 10/03/2012  . Paroxysmal SVT (supraventricular tachycardia) 10/03/2012    Subjective:  CC:   Chief Complaint  Patient presents with  . Follow-up    on thyroid and weight gain.    HPI:   [name] is a 38 y.o. female who presents for progressive weight gain of 30 lbs since hyperthyroid state was found and reversed.  diffuse muscle aches preventing her from doing any rigourous exercise other than walking. Very frustrated. .   Had thyroid ablation in December.  Her recurrent palpitations have improved but not completely resolved.    Past Medical History  Diagnosis Date  . Arthritis   . Chicken pox   . Allergy   . Hypertension   . Thyroid disease   . Irritable bowel syndrome (IBS) 2004  . Colitis 2004  . Colon polyps   . Palpitations 2000    Past Surgical History  Procedure Laterality Date  . Cholecystectomy  2001  . Tonsilectomy/adenoidectomy with myringotomy    . Cesarean section  2006,2010  . Tubal ligation  2010  . Carpal tunnel release Left 2012    Two cyst removal       The following portions of the patient's history were reviewed and updated as appropriate: Allergies, current medications, and problem list.    Review of Systems:   Patient denies headache, fevers, malaise, unintentional weight loss, skin rash, eye pain, sinus congestion and sinus pain, sore throat, dysphagia,  hemoptysis , cough, dyspnea, wheezing, chest pain, palpitations, orthopnea, edema, abdominal pain, nausea,  melena, diarrhea, constipation, flank pain, dysuria, hematuria, urinary  Frequency, nocturia, numbness, tingling, seizures,  Focal weakness, Loss of consciousness,  Tremor, insomnia, depression, anxiety, and suicidal ideation.     History   Social History  . Marital Status: Married    Spouse Name: N/A    Number of Children: N/A  . Years of Education: N/A   Occupational History  . Not on file.   Social History Main Topics  . Smoking status: Never Smoker   . Smokeless tobacco: Never Used  . Alcohol Use: Yes  . Drug Use: No  . Sexual Activity: Yes    Partners: Male   Other Topics Concern  . Not on file   Social History Narrative   Married: 2 kids: ages 698 and 4.5 yrs   Work: [employer]       Regular exercise: not at this time   Caffeine use: coffee in the AM: dt soda at night                Objective:  Filed Vitals:   05/04/13 1521  BP: 120/78  Pulse: 68  Temp: 98.1 F (36.7 C)  Resp: 16     General appearance: alert, cooperative and appears stated age Ears: normal TM's and external ear canals both ears Throat: lips, mucosa, and tongue normal; teeth and gums normal Neck: no adenopathy, no carotid bruit, supple, symmetrical, trachea midline and thyroid not enlarged, symmetric, no tenderness/mass/nodules Back: symmetric, no curvature. ROM normal. No CVA tenderness. Lungs:  clear to auscultation bilaterally Heart: regular rate and rhythm, S1, S2 normal, no murmur, click, rub or gallop Abdomen: soft, non-tender; bowel sounds normal; no masses,  no organomegaly Pulses: 2+ and symmetric Skin: Skin color, texture, turgor normal. No rashes or lesions Lymph nodes: Cervical, supraclavicular, and axillary nodes normal.  Assessment and Plan:  Iatrogenic hypothyroidism Thyroid is still underactive and  dose has been increased today  Lab Results  Component Value Date   TSH 8.956* 04/30/2013     Obesity, unspecified I have addressed  BMI and recommended a low  glycemic index diet utilizing smaller more frequent meals to increase metabolism.  I have also recommended that patient start exercising with a goal of 30 minutes of aerobic exercise a minimum of 5 days per week. Screening for lipid disorders, thyroid and diabetes to be done today.     Updated Medication List Outpatient Encounter Prescriptions as of 05/04/2013  Medication Sig  . fexofenadine (ALLEGRA) 180 MG tablet Take 180 mg by mouth daily.  . fluticasone (FLONASE) 50 MCG/ACT nasal spray Place 2 sprays into the nose daily.  . hydrochlorothiazide (HYDRODIURIL) 25 MG tablet Take 25 mg by mouth daily.  Marland Kitchen. levothyroxine (SYNTHROID, LEVOTHROID) 25 MCG tablet Take with the  200 mcg dose daily in the morning  . methocarbamol (ROBAXIN) 750 MG tablet Take 1 tablet (750 mg total) by mouth 4 (four) times daily as needed.  . propranolol (INDERAL) 20 MG tablet Take 1 tablet (20 mg total) by mouth 3 (three) times daily. As needed for SVT  . [DISCONTINUED] levothyroxine (SYNTHROID, LEVOTHROID) 200 MCG tablet Take 1 tablet (200 mcg total) by mouth daily before breakfast.  . furosemide (LASIX) 20 MG tablet Take 1 tablet (20 mg total) by mouth daily.  Marland Kitchen. levothyroxine (SYNTHROID) 200 MCG tablet Take 1 tablet (200 mcg total) by mouth daily before breakfast.     No orders of the defined types were placed in this encounter.    No Follow-up on file.

## 2013-05-05 NOTE — Assessment & Plan Note (Signed)
Thyroid is still underactive and  dose has been increased today  Lab Results  Component Value Date   TSH 8.956* 04/30/2013

## 2013-05-10 ENCOUNTER — Telehealth: Payer: Self-pay | Admitting: *Deleted

## 2013-05-10 ENCOUNTER — Encounter: Payer: Self-pay | Admitting: Internal Medicine

## 2013-05-10 MED ORDER — PHENTERMINE HCL 37.5 MG PO TABS: 19.0000 mg | ORAL_TABLET | Freq: Two times a day (BID) | ORAL | Status: DC

## 2013-05-10 NOTE — Telephone Encounter (Signed)
Ok  ,take 1/2 tablet phentermine at 8 am and  2 pm  Return in one week for RN visit to check vital sigs  Return for OV with me in 3 months

## 2013-05-10 NOTE — Telephone Encounter (Signed)
Patient notified and voiced understanding script placed up front for pick up.

## 2013-05-10 NOTE — Telephone Encounter (Signed)
Patient has left voicemail stating she would like to go ahead and try the medication for weight loss as discussed at last OV , patient says she has gained 2 more pounds and is very discouraged .Please advise.

## 2013-05-11 NOTE — Telephone Encounter (Signed)
I have authorized the use of phentermine for 3 months.  Please have your vital signs checked a week after starting, and return to see me in 3 months.tkae 1`/2 tablet in the morning,  The second half by 2 PM to avoid insomnia If you have not lost 5% of your starting weight by the end of the  3 months we will have to discontinue it.  

## 2013-05-17 ENCOUNTER — Ambulatory Visit (INDEPENDENT_AMBULATORY_CARE_PROVIDER_SITE_OTHER): Payer: 59 | Admitting: *Deleted

## 2013-05-17 VITALS — BP 146/84

## 2013-05-17 DIAGNOSIS — Z79899 Other long term (current) drug therapy: Secondary | ICD-10-CM

## 2013-05-17 NOTE — Progress Notes (Signed)
   Subjective:    Patient ID: [name], female    DOB: 11/19/1975, 38 y.o.   MRN: [id number]  HPI    Review of Systems     Objective:   Physical Exam        Assessment & Plan:  Patient came in today for a blood pressure check. BP was 146/84

## 2013-05-19 ENCOUNTER — Encounter: Payer: Self-pay | Admitting: Internal Medicine

## 2013-05-19 ENCOUNTER — Other Ambulatory Visit: Payer: Self-pay | Admitting: *Deleted

## 2013-05-19 MED ORDER — HYDROCHLOROTHIAZIDE 25 MG PO TABS: 25.0000 mg | ORAL_TABLET | Freq: Every day | ORAL | Status: DC

## 2013-06-09 ENCOUNTER — Ambulatory Visit: Payer: 59 | Admitting: Internal Medicine

## 2013-06-10 ENCOUNTER — Ambulatory Visit: Payer: 59 | Admitting: Internal Medicine

## 2013-06-16 ENCOUNTER — Other Ambulatory Visit (INDEPENDENT_AMBULATORY_CARE_PROVIDER_SITE_OTHER): Payer: 59

## 2013-06-16 DIAGNOSIS — E039 Hypothyroidism, unspecified: Secondary | ICD-10-CM

## 2013-06-16 LAB — T4, FREE: Free T4: 1.38 ng/dL (ref 0.60–1.60)

## 2013-06-17 ENCOUNTER — Encounter: Payer: Self-pay | Admitting: Internal Medicine

## 2013-06-17 ENCOUNTER — Other Ambulatory Visit (INDEPENDENT_AMBULATORY_CARE_PROVIDER_SITE_OTHER): Payer: 59

## 2013-06-17 DIAGNOSIS — E039 Hypothyroidism, unspecified: Secondary | ICD-10-CM

## 2013-06-17 LAB — TSH: TSH: 0.77 u[IU]/mL (ref 0.35–4.50)

## 2013-06-17 NOTE — Addendum Note (Signed)
Addended by: Sherlene Shams on: 06/17/2013 12:49 PM   Modules accepted: Orders

## 2013-06-19 ENCOUNTER — Encounter: Payer: Self-pay | Admitting: Internal Medicine

## 2013-08-10 ENCOUNTER — Ambulatory Visit (INDEPENDENT_AMBULATORY_CARE_PROVIDER_SITE_OTHER): Payer: 59 | Admitting: Internal Medicine

## 2013-08-10 ENCOUNTER — Encounter: Payer: Self-pay | Admitting: Internal Medicine

## 2013-08-10 ENCOUNTER — Ambulatory Visit: Payer: 59 | Admitting: Internal Medicine

## 2013-08-10 VITALS — BP 128/88 | HR 86 | Temp 97.8°F | Resp 18 | Ht 64.75 in | Wt 190.0 lb

## 2013-08-10 DIAGNOSIS — Z79899 Other long term (current) drug therapy: Secondary | ICD-10-CM

## 2013-08-10 DIAGNOSIS — R928 Other abnormal and inconclusive findings on diagnostic imaging of breast: Secondary | ICD-10-CM

## 2013-08-10 DIAGNOSIS — E032 Hypothyroidism due to medicaments and other exogenous substances: Secondary | ICD-10-CM

## 2013-08-10 DIAGNOSIS — I471 Supraventricular tachycardia, unspecified: Secondary | ICD-10-CM

## 2013-08-10 DIAGNOSIS — E669 Obesity, unspecified: Secondary | ICD-10-CM

## 2013-08-10 MED ORDER — PHENTERMINE HCL 37.5 MG PO TABS: 19.0000 mg | ORAL_TABLET | Freq: Two times a day (BID) | ORAL | Status: DC

## 2013-08-10 MED ORDER — LEVOTHYROXINE SODIUM 25 MCG PO TABS: ORAL_TABLET | ORAL | Status: DC

## 2013-08-10 MED ORDER — LEVOTHYROXINE SODIUM 200 MCG PO TABS: 200.0000 ug | ORAL_TABLET | Freq: Every day | ORAL | Status: DC

## 2013-08-10 NOTE — Patient Instructions (Addendum)
You are doing great!     Here are a couple of low carb treats to try for snacks:   Try the quest bars (questnutrition.com)  Try the low carb pita bread and flatbread by Michael's ([employer] in the deli .bakery section )  Try the Dreamfield's pasta ([employer], [employer] [place], and HT)

## 2013-08-10 NOTE — Progress Notes (Signed)
Patient ID: [name], female   DOB: Sep 30, 1975, 38 y.o.   MRN: [id number]  Patient Active Problem List   Diagnosis Date Noted  . Atypical squamous cells of undetermined significance (ASCUS) on Papanicolaou smear of cervix 02/04/2013  . Iatrogenic hypothyroidism 02/04/2013  . Encounter for preventive health examination 01/31/2013  . Graves disease 11/26/2012  . Abnormal mammogram 10/14/2012  . Obesity, unspecified 10/03/2012  . Colitis, nonspecific 10/03/2012  . Edema 10/03/2012  . Paroxysmal SVT (supraventricular tachycardia) 10/03/2012    Subjective:  CC:   Chief Complaint  Patient presents with  . Follow-up    medication for weight loss and thyroid    HPI:   [name] is a 38 y.o. female who presents for Follow up on intentional wt loss using phentermine.  Patient has lost 19 lbs since starting phentermine 3 months ago.  She is tolerating the medication well and is having no trouble sleeping.  She has been exercising 3 days per week and following a low glycemic index diet  She has a goal wt of 160 lbs.     Past Medical History  Diagnosis Date  . Arthritis   . Chicken pox   . Allergy   . Hypertension   . Thyroid disease   . Irritable bowel syndrome (IBS) 2004  . Colitis 2004  . Colon polyps   . Palpitations 2000    Past Surgical History  Procedure Laterality Date  . Cholecystectomy  2001  . Tonsilectomy/adenoidectomy with myringotomy    . Cesarean section  2006,2010  . Tubal ligation  2010  . Carpal tunnel release Left 2012    Two cyst removal       The following portions of the patient's history were reviewed and updated as appropriate: Allergies, current medications, and problem list.    Review of Systems:   Patient denies headache, fevers, malaise, unintentional weight loss, skin rash, eye pain, sinus congestion and sinus pain, sore throat, dysphagia,  hemoptysis , cough, dyspnea, wheezing, chest pain, palpitations, orthopnea, edema, abdominal  pain, nausea, melena, diarrhea, constipation, flank pain, dysuria, hematuria, urinary  Frequency, nocturia, numbness, tingling, seizures,  Focal weakness, Loss of consciousness,  Tremor, insomnia, depression, anxiety, and suicidal ideation.     History   Social History  . Marital Status: Married    Spouse Name: N/A    Number of Children: N/A  . Years of Education: N/A   Occupational History  . Not on file.   Social History Main Topics  . Smoking status: Never Smoker   . Smokeless tobacco: Never Used  . Alcohol Use: Yes  . Drug Use: No  . Sexual Activity: Yes    Partners: Male   Other Topics Concern  . Not on file   Social History Narrative   Married: 2 kids: ages 5 and 4.5 yrs   Work: [employer]       Regular exercise: not at this time   Caffeine use: coffee in the AM: dt soda at night                Objective:  Filed Vitals:   08/10/13 1436  BP: 128/88  Pulse: 86  Temp: 97.8 F (36.6 C)  Resp: 18     General appearance: alert, cooperative and appears stated age Ears: normal TM's and external ear canals both ears Throat: lips, mucosa, and tongue normal; teeth and gums normal Neck: no adenopathy, no carotid bruit, supple, symmetrical, trachea midline and thyroid not enlarged,  symmetric, no tenderness/mass/nodules Back: symmetric, no curvature. ROM normal. No CVA tenderness. Lungs: clear to auscultation bilaterally Heart: regular rate and rhythm, S1, S2 normal, no murmur, click, rub or gallop Abdomen: soft, non-tender; bowel sounds normal; no masses,  no organomegaly Pulses: 2+ and symmetric Skin: Skin color, texture, turgor normal. No rashes or lesions Lymph nodes: Cervical, supraclavicular, and axillary nodes normal.  Assessment and Plan:  Obesity, unspecified Improving with appetite suppression using phentermine,  Low GI diet and exercise,  She has lost 10%  of BW in 3 months.  Will continue phentermine for 3 more months and reassess.  Diet  reviewed and additional advice given to avoid pitfalls.   Iatrogenic hypothyroidism Thyroid function is WNL on current dose.  No current changes needed.  Lab Results  Component Value Date   TSH 0.77 06/17/2013     Paroxysmal SVT (supraventricular tachycardia) Secondary to Graves' disease. Her palpitations have resolved, even with use of phentermine. Continue when necessary propanolol.    Abnormal mammogram Follow up diagnostic mammogram April 2015 was BIRADS 3 (probably benign) and 6 month follow up was advised (October 2015)    Updated Medication List Outpatient Encounter Prescriptions as of 08/10/2013  Medication Sig  . fexofenadine (ALLEGRA) 180 MG tablet Take 180 mg by mouth daily.  . fluticasone (FLONASE) 50 MCG/ACT nasal spray Place 2 sprays into the nose daily.  . hydrochlorothiazide (HYDRODIURIL) 25 MG tablet Take 1 tablet (25 mg total) by mouth daily.  Marland Kitchen levothyroxine (SYNTHROID) 200 MCG tablet Take 1 tablet (200 mcg total) by mouth daily before breakfast.  . levothyroxine (SYNTHROID, LEVOTHROID) 25 MCG tablet Take with the  200 mcg dose daily in the morning  . methocarbamol (ROBAXIN) 750 MG tablet Take 1 tablet (750 mg total) by mouth 4 (four) times daily as needed.  . phentermine (ADIPEX-P) 37.5 MG tablet Take 0.5 tablets (18.75 mg total) by mouth 2 (two) times daily.  . propranolol (INDERAL) 20 MG tablet Take 1 tablet (20 mg total) by mouth 3 (three) times daily. As needed for SVT  . [DISCONTINUED] levothyroxine (SYNTHROID) 200 MCG tablet Take 1 tablet (200 mcg total) by mouth daily before breakfast.  . [DISCONTINUED] levothyroxine (SYNTHROID, LEVOTHROID) 25 MCG tablet Take with the  200 mcg dose daily in the morning  . [DISCONTINUED] phentermine (ADIPEX-P) 37.5 MG tablet Take 0.5 tablets (18.75 mg total) by mouth 2 (two) times daily.  . [DISCONTINUED] furosemide (LASIX) 20 MG tablet Take 1 tablet (20 mg total) by mouth daily.     Orders Placed This Encounter   Procedures  . MM Digital Diagnostic Unilat L  . Comp Met (CMET)    Return in about 3 months (around 11/10/2013).

## 2013-08-10 NOTE — Progress Notes (Signed)
Pre-visit discussion using our clinic review tool. No additional management support is needed unless otherwise documented below in the visit note.  

## 2013-08-11 ENCOUNTER — Encounter: Payer: Self-pay | Admitting: Internal Medicine

## 2013-08-11 LAB — COMPREHENSIVE METABOLIC PANEL
ALT: 14 U/L (ref 0–35)
AST: 15 U/L (ref 0–37)
Albumin: 4.6 g/dL (ref 3.5–5.2)
Alkaline Phosphatase: 36 U/L — ABNORMAL LOW (ref 39–117)
BILIRUBIN TOTAL: 1.3 mg/dL — AB (ref 0.2–1.2)
BUN: 13 mg/dL (ref 6–23)
CO2: 28 mEq/L (ref 19–32)
Calcium: 9.2 mg/dL (ref 8.4–10.5)
Chloride: 98 mEq/L (ref 96–112)
Creatinine, Ser: 0.7 mg/dL (ref 0.4–1.2)
GFR: 94.92 mL/min (ref >60.00)
GLUCOSE: 81 mg/dL (ref 70–99)
Potassium: 3.7 mEq/L (ref 3.5–5.1)
SODIUM: 134 meq/L — AB (ref 135–145)
Total Protein: 7 g/dL (ref 6.0–8.3)

## 2013-08-13 NOTE — Assessment & Plan Note (Signed)
Follow up diagnostic mammogram April 2015 was BIRADS 3 (probably benign) and 6 month follow up was advised (October 2015)

## 2013-08-13 NOTE — Assessment & Plan Note (Addendum)
Improving with appetite suppression using phentermine,  Low GI diet and exercise,  She has lost 10%  of BW in 3 months.  Will continue phentermine for 3 more months and reassess.  Diet reviewed and additional advice given to avoid pitfalls.

## 2013-08-13 NOTE — Assessment & Plan Note (Signed)
Secondary to Graves' disease. Her palpitations have resolved, even with use of phentermine. Continue when necessary propanolol.

## 2013-08-13 NOTE — Assessment & Plan Note (Signed)
Thyroid function is WNL on current dose.  No current changes needed.  Lab Results  Component Value Date   TSH 0.77 06/17/2013

## 2013-08-15 ENCOUNTER — Other Ambulatory Visit: Payer: Self-pay | Admitting: Internal Medicine

## 2013-08-16 ENCOUNTER — Other Ambulatory Visit: Payer: Self-pay | Admitting: *Deleted

## 2013-08-16 MED ORDER — LEVOTHYROXINE SODIUM 200 MCG PO TABS: 200.0000 ug | ORAL_TABLET | Freq: Every day | ORAL | Status: DC

## 2013-08-16 NOTE — Progress Notes (Signed)
Patient script went in as no print reorder to go normal to [facility] Levothyroxine 200 mcg.

## 2013-11-03 ENCOUNTER — Encounter: Payer: Self-pay | Admitting: Internal Medicine

## 2013-11-08 ENCOUNTER — Ambulatory Visit: Payer: Self-pay | Admitting: Internal Medicine

## 2013-11-12 ENCOUNTER — Encounter: Payer: Self-pay | Admitting: Internal Medicine

## 2013-11-12 ENCOUNTER — Ambulatory Visit (INDEPENDENT_AMBULATORY_CARE_PROVIDER_SITE_OTHER): Payer: 59 | Admitting: Internal Medicine

## 2013-11-12 VITALS — BP 126/78 | HR 62 | Temp 97.8°F | Resp 16 | Ht 64.75 in | Wt 169.2 lb

## 2013-11-12 DIAGNOSIS — Z79899 Other long term (current) drug therapy: Secondary | ICD-10-CM

## 2013-11-12 DIAGNOSIS — E039 Hypothyroidism, unspecified: Secondary | ICD-10-CM

## 2013-11-12 DIAGNOSIS — E032 Hypothyroidism due to medicaments and other exogenous substances: Secondary | ICD-10-CM

## 2013-11-12 DIAGNOSIS — R609 Edema, unspecified: Secondary | ICD-10-CM

## 2013-11-12 DIAGNOSIS — I10 Essential (primary) hypertension: Secondary | ICD-10-CM

## 2013-11-12 DIAGNOSIS — E669 Obesity, unspecified: Secondary | ICD-10-CM

## 2013-11-12 DIAGNOSIS — E876 Hypokalemia: Secondary | ICD-10-CM

## 2013-11-12 MED ORDER — PHENTERMINE HCL 37.5 MG PO TABS: 19.0000 mg | ORAL_TABLET | Freq: Two times a day (BID) | ORAL | Status: DC

## 2013-11-12 NOTE — Progress Notes (Addendum)
Patient ID: [name], female   DOB: 01-25-1975, 38 y.o.   MRN: [id number]  Patient Active Problem List   Diagnosis Date Noted  . Essential hypertension 11/14/2013  . Hypokalemia 11/14/2013  . Atypical squamous cells of undetermined significance (ASCUS) on Papanicolaou smear of cervix 02/04/2013  . Iatrogenic hypothyroidism 02/04/2013  . Encounter for preventive health examination 01/31/2013  . Graves disease 11/26/2012  . Abnormal mammogram 10/14/2012  . Obesity 10/03/2012  . Colitis, nonspecific 10/03/2012  . Edema 10/03/2012  . Paroxysmal SVT (supraventricular tachycardia) 10/03/2012    Subjective:  CC:   Chief Complaint  Patient presents with  . Follow-up    Thyroid  . Weight Loss    HPI:   [name] is a 38 y.o. female who presents for  Follow up on hypothyroidism secondary to Graves Disease, hypertension  and obesity.  She has been taking phentermine  daily for appetite suppression and exercising 5 days per week.  She is tolerating the medication without tachycardia and insomnia.  She has tried reducing the dose but her appetite increases.  She has had a 21 b wt loss on phentermine.    Past Medical History  Diagnosis Date  . Arthritis   . Chicken pox   . Allergy   . Hypertension   . Thyroid disease   . Irritable bowel syndrome (IBS) 2004  . Colitis 2004  . Colon polyps   . Palpitations 2000    Past Surgical History  Procedure Laterality Date  . Cholecystectomy  2001  . Tonsilectomy/adenoidectomy with myringotomy    . Cesarean section  2006,2010  . Tubal ligation  2010  . Carpal tunnel release Left 2012    Two cyst removal       The following portions of the patient's history were reviewed and updated as appropriate: Allergies, current medications, and problem list.    Review of Systems:   Patient denies headache, fevers, malaise, unintentional weight loss, skin rash, eye pain, sinus congestion and sinus pain, sore throat, dysphagia,   hemoptysis , cough, dyspnea, wheezing, chest pain, palpitations, orthopnea, edema, abdominal pain, nausea, melena, diarrhea, constipation, flank pain, dysuria, hematuria, urinary  Frequency, nocturia, numbness, tingling, seizures,  Focal weakness, Loss of consciousness,  Tremor, insomnia, depression, anxiety, and suicidal ideation.     History   Social History  . Marital Status: Married    Spouse Name: N/A    Number of Children: N/A  . Years of Education: N/A   Occupational History  . Not on file.   Social History Main Topics  . Smoking status: Never Smoker   . Smokeless tobacco: Never Used  . Alcohol Use: Yes  . Drug Use: No  . Sexual Activity:    Partners: Male   Other Topics Concern  . Not on file   Social History Narrative   Married: 2 kids: ages 42 and 4.5 yrs   Work: [employer]       Regular exercise: not at this time   Caffeine use: coffee in the AM: dt soda at night                Objective:  Filed Vitals:   11/12/13 1606  BP: 126/78  Pulse: 62  Temp: 97.8 F (36.6 C)  Resp: 16     General appearance: alert, cooperative and appears stated age Ears: normal TM's and external ear canals both ears Throat: lips, mucosa, and tongue normal; teeth and gums normal Neck: no adenopathy, no carotid  bruit, supple, symmetrical, trachea midline and thyroid not enlarged, symmetric, no tenderness/mass/nodules Back: symmetric, no curvature. ROM normal. No CVA tenderness. Lungs: clear to auscultation bilaterally Heart: regular rate and rhythm, S1, S2 normal, no murmur, click, rub or gallop Abdomen: soft, non-tender; bowel sounds normal; no masses,  no organomegaly Pulses: 2+ and symmetric Skin: Skin color, texture, turgor normal. No rashes or lesions Lymph nodes: Cervical, supraclavicular, and axillary nodes normal.  Assessment and Plan:  Obesity She has lost over 40 lbs since August using phentermine and a regular exercise program.  I have authorized the  refill  of phentermine for 3 more months to avoid the pitfalls of the winter holidays .   Goal is 10 lbs   Body mass index is 28.37 kg/(m^2).   Iatrogenic hypothyroidism Thyroid function is mildly suppressed on current dose.  She is tolerating current dose without fatigue or other side effects. No current changes needed. Will recheck in 3 months  Lab Results  Component Value Date   TSH 0.039* 11/12/2013    Hypokalemia Stopping hctz,  Potassium supplement and maxzide ordered.     Updated Medication List Outpatient Encounter Prescriptions as of 11/12/2013  Medication Sig  . fexofenadine (ALLEGRA) 180 MG tablet Take 180 mg by mouth daily.  . fluticasone (FLONASE) 50 MCG/ACT nasal spray Place 2 sprays into the nose daily.  . hydrochlorothiazide (HYDRODIURIL) 25 MG tablet Take 1 tablet by mouth  daily  . levothyroxine (SYNTHROID) 200 MCG tablet Take 1 tablet (200 mcg total) by mouth daily before breakfast.  . levothyroxine (SYNTHROID, LEVOTHROID) 25 MCG tablet Take with the  200 mcg dose daily in the morning  . methocarbamol (ROBAXIN) 750 MG tablet Take 1 tablet (750 mg total) by mouth 4 (four) times daily as needed.  . phentermine (ADIPEX-P) 37.5 MG tablet Take 0.5 tablets (18.75 mg total) by mouth 2 (two) times daily.  . propranolol (INDERAL) 20 MG tablet Take 1 tablet (20 mg total) by mouth 3 (three) times daily. As needed for SVT  . [DISCONTINUED] phentermine (ADIPEX-P) 37.5 MG tablet Take 0.5 tablets (18.75 mg total) by mouth 2 (two) times daily.  . potassium chloride SA (K-DUR,KLOR-CON) 20 MEQ tablet Take 1 tablet (20 mEq total) by mouth daily.  Marland Kitchen triamterene-hydrochlorothiazide (MAXZIDE-25) 37.5-25 MG per tablet Take 1 tablet by mouth daily.     Orders Placed This Encounter  Procedures  . Comp Met (CMET)  . Magnesium  . TSH    No Follow-up on file.

## 2013-11-12 NOTE — Patient Instructions (Signed)
I have authorized the use of phentermine for 3  More months.  Your goal is 10 lb wt loss by the time your return

## 2013-11-12 NOTE — Progress Notes (Signed)
Pre-visit discussion using our clinic review tool. No additional management support is needed unless otherwise documented below in the visit note.  

## 2013-11-13 LAB — COMPREHENSIVE METABOLIC PANEL
ALK PHOS: 34 U/L — AB (ref 39–117)
ALT: 11 U/L (ref 0–35)
AST: 11 U/L (ref 0–37)
Albumin: 4.7 g/dL (ref 3.5–5.2)
BILIRUBIN TOTAL: 1.1 mg/dL (ref 0.2–1.2)
BUN: 5 mg/dL — ABNORMAL LOW (ref 6–23)
CO2: 30 mEq/L (ref 19–32)
Calcium: 9.6 mg/dL (ref 8.4–10.5)
Chloride: 96 mEq/L (ref 96–112)
Creat: 0.61 mg/dL (ref 0.50–1.10)
GLUCOSE: 58 mg/dL — AB (ref 70–99)
Potassium: 3.1 mEq/L — ABNORMAL LOW (ref 3.5–5.3)
SODIUM: 137 meq/L (ref 135–145)
TOTAL PROTEIN: 7 g/dL (ref 6.0–8.3)

## 2013-11-13 LAB — TSH: TSH: 0.039 u[IU]/mL — AB (ref 0.350–4.500)

## 2013-11-13 LAB — MAGNESIUM: Magnesium: 1.9 mg/dL (ref 1.5–2.5)

## 2013-11-14 ENCOUNTER — Encounter: Payer: Self-pay | Admitting: Internal Medicine

## 2013-11-14 DIAGNOSIS — E876 Hypokalemia: Secondary | ICD-10-CM | POA: Insufficient documentation

## 2013-11-14 DIAGNOSIS — I10 Essential (primary) hypertension: Secondary | ICD-10-CM | POA: Insufficient documentation

## 2013-11-14 MED ORDER — POTASSIUM CHLORIDE CRYS ER 20 MEQ PO TBCR: 20.0000 meq | EXTENDED_RELEASE_TABLET | Freq: Every day | ORAL | Status: DC

## 2013-11-14 MED ORDER — TRIAMTERENE-HCTZ 37.5-25 MG PO TABS: 1.0000 | ORAL_TABLET | Freq: Every day | ORAL | Status: DC

## 2013-11-14 NOTE — Assessment & Plan Note (Signed)
Thyroid function is mildly suppressed on current dose.  She is tolerating current dose without fatigue or other side effects. No current changes needed. Will recheck in 3 months  Lab Results  Component Value Date   TSH 0.039* 11/12/2013

## 2013-11-14 NOTE — Addendum Note (Signed)
Addended by: Sherlene ShamsULLO, [place] Canty L on: 11/14/2013 03:49 PM   Modules accepted: Orders

## 2013-11-14 NOTE — Assessment & Plan Note (Signed)
Change to lasix for increased edema.  Return for BMET end of February

## 2013-11-14 NOTE — Assessment & Plan Note (Addendum)
She has lost over 40 lbs since August using phentermine and a regular exercise program.  I have authorized the refill  of phentermine for 3 more months to avoid the pitfalls of the winter holidays .   Goal is 10 lbs   Body mass index is 28.37 kg/(m^2).

## 2013-11-14 NOTE — Assessment & Plan Note (Signed)
Stopping hctz,  Potassium supplement and maxzide ordered.

## 2013-11-15 MED ORDER — LEVOTHYROXINE SODIUM 200 MCG PO TABS: 200.0000 ug | ORAL_TABLET | Freq: Every day | ORAL | Status: DC

## 2013-11-15 NOTE — Telephone Encounter (Signed)
done

## 2013-12-27 IMAGING — RF DG ESOPHAGUS
10 series · 20 of 21 positions shown · non-contrast
Comparison: None.

CLINICAL DATA: Dysphagia.

ESOPHAGUS/BARIUM SWALLOW/TABLET STUDY
Fluoroscopy Time: 1 minute 0 seconds

[Series 1: run · 5 of 5 slices shown (1 of 10)]
[im 1/5]
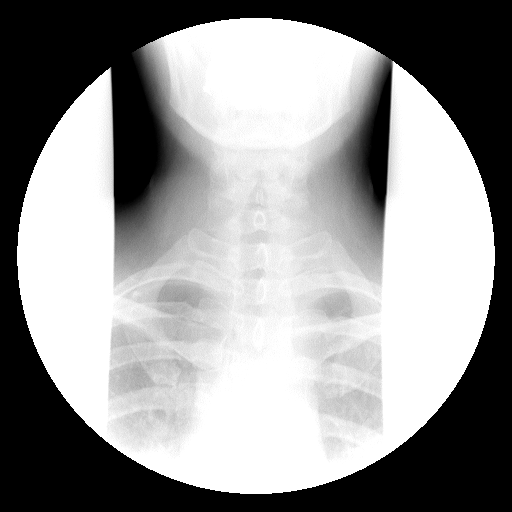
[im 2/5]
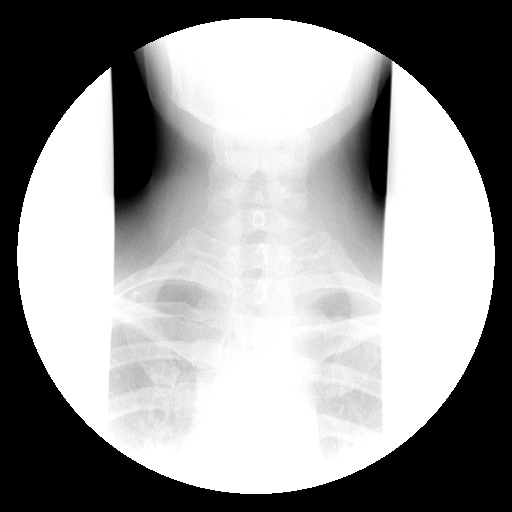
[im 3/5]
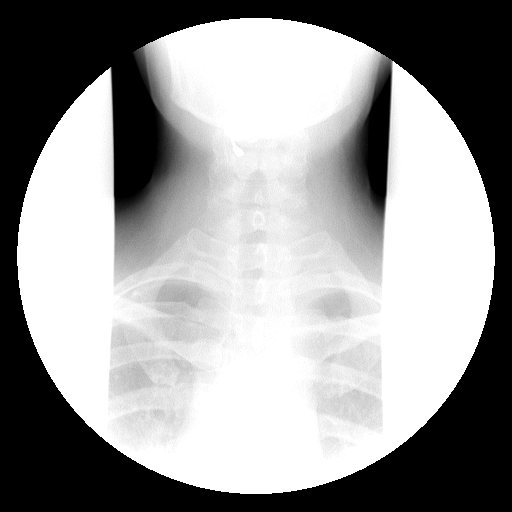
[im 4/5]
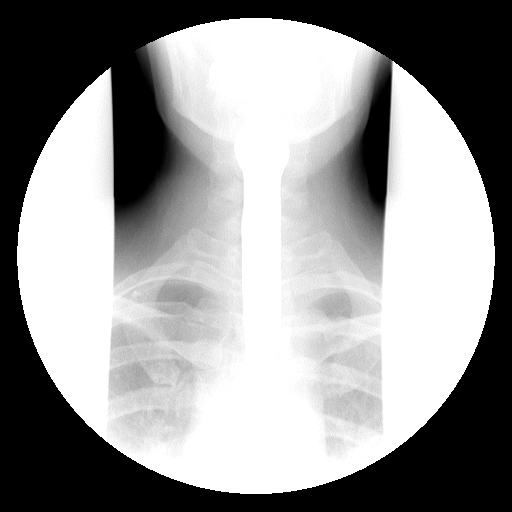
[im 5/5]
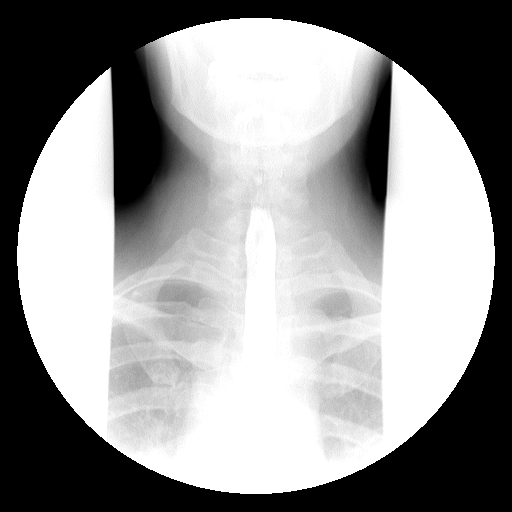

[Series 2: run · 3 of 3 slices shown (2 of 10)]
[im 1/3]
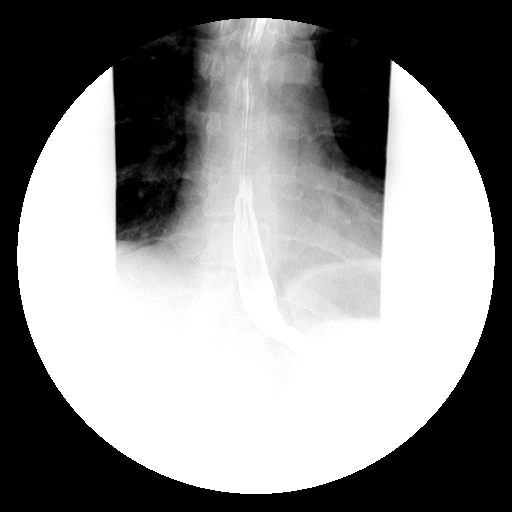
[im 2/3]
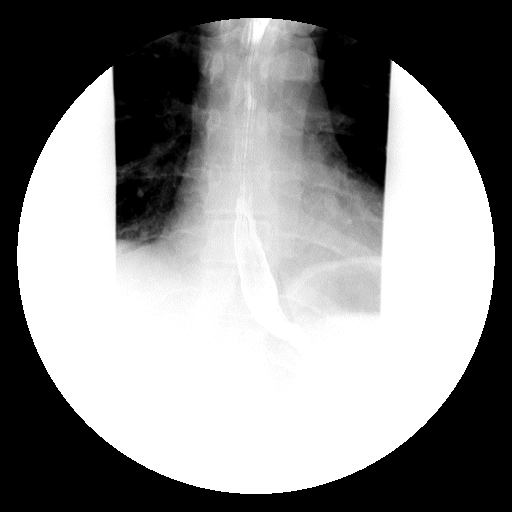
[im 3/3]
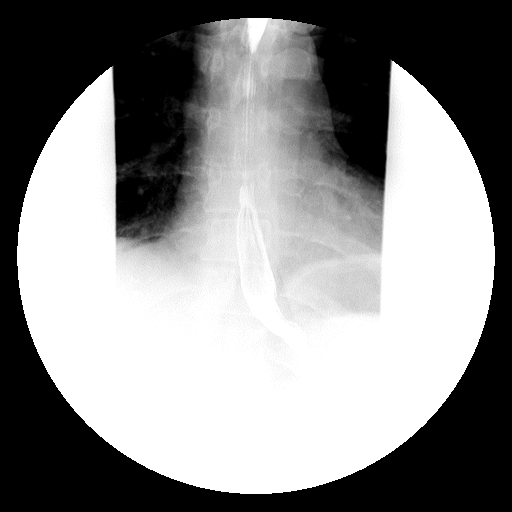

[Series 3: run · 1 of 1 slices shown (3 of 10)]
[im 1/1]
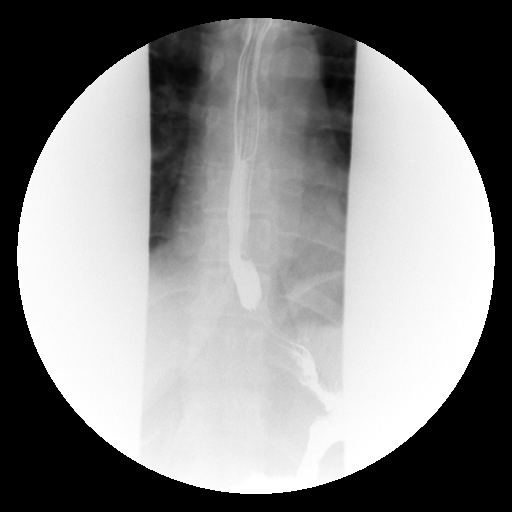

[Series 4: run · 1 of 1 slices shown (4 of 10)]
[im 1/1]
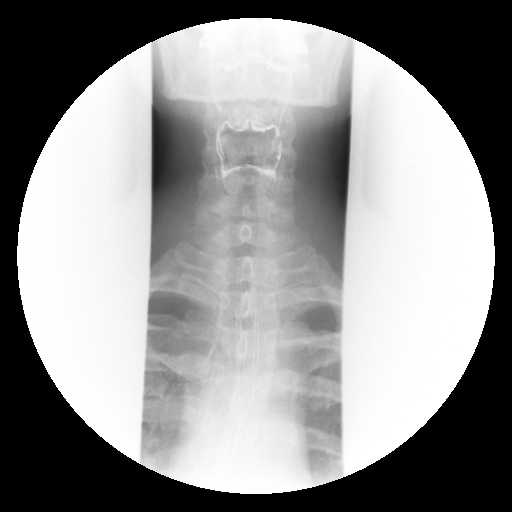

[Series 5: run · 5 of 6 slices shown (5 of 10)]
[im 2/6]
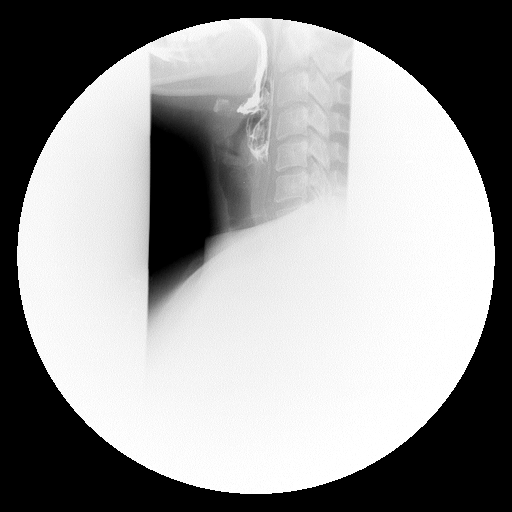
[im 3/6]
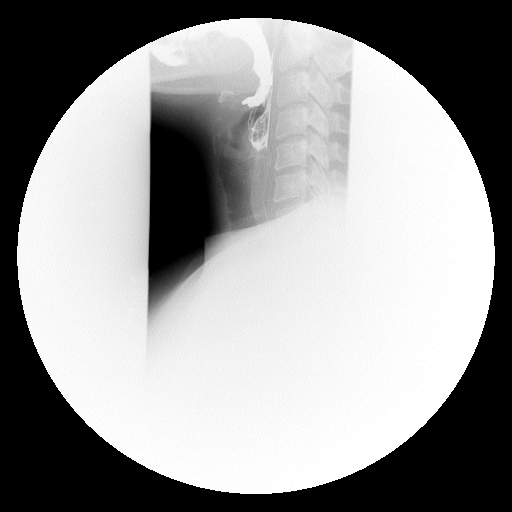
[im 4/6]
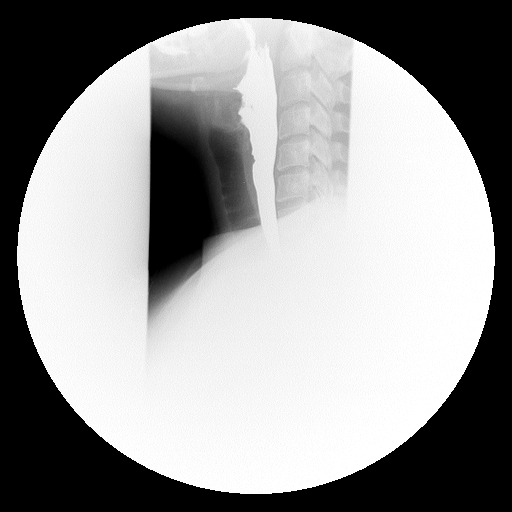
[im 5/6]
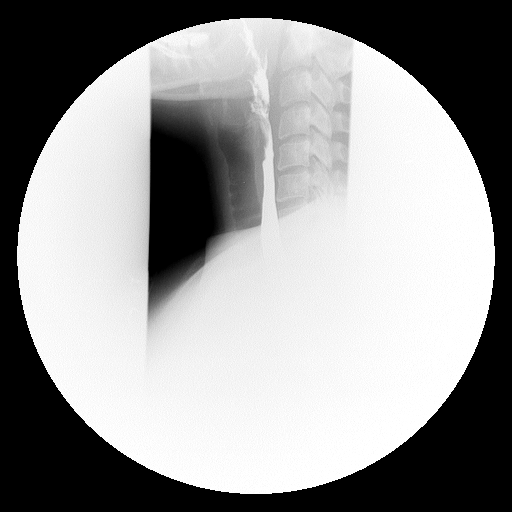
[im 6/6]
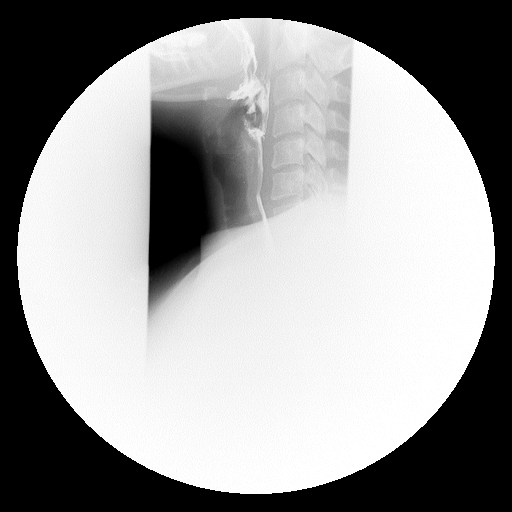

[Series 6: run · 1 of 1 slices shown (6 of 10)]
[im 1/1]
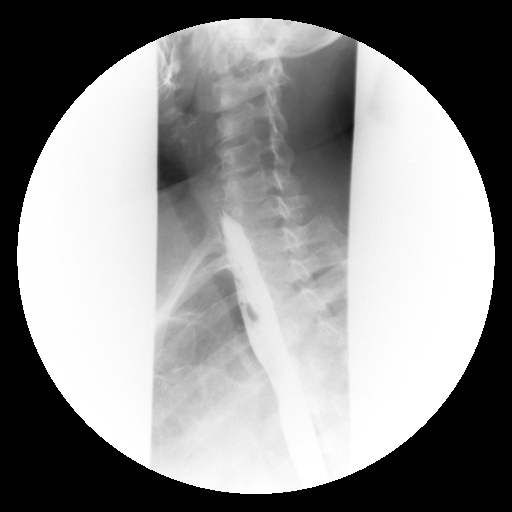

[Series 7: run · 1 of 1 slices shown (7 of 10)]
[im 1/1]
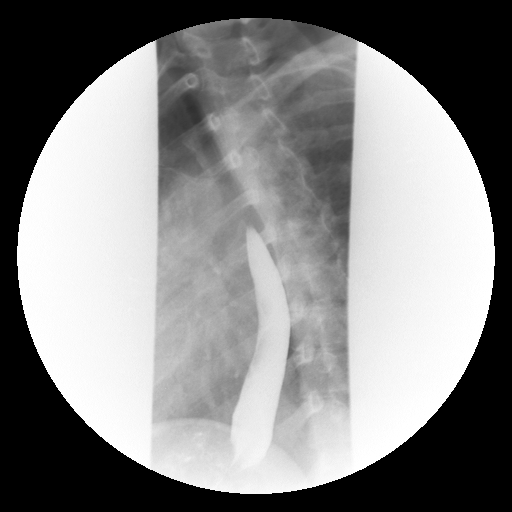

[Series 8: run · 1 of 1 slices shown (8 of 10)]
[im 1/1]
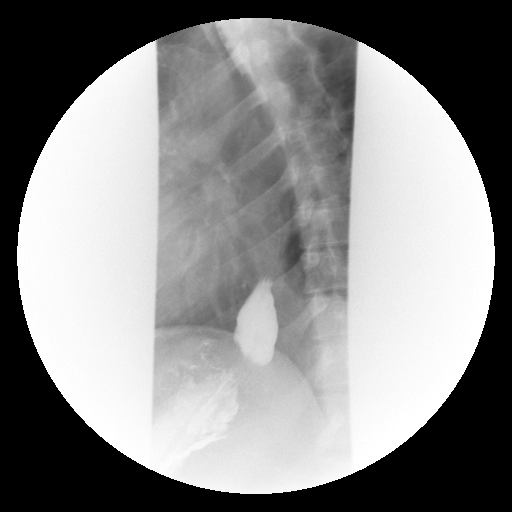

[Series 9: run · 1 of 1 slices shown (9 of 10)]
[im 1/1]
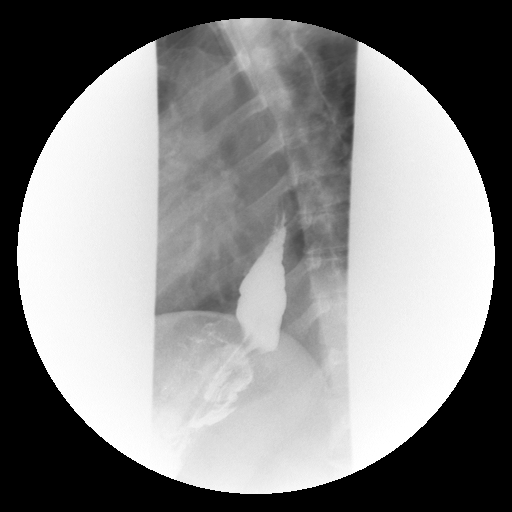

[Series 10: run · 1 of 1 slices shown (10 of 10)]
[im 1/1]
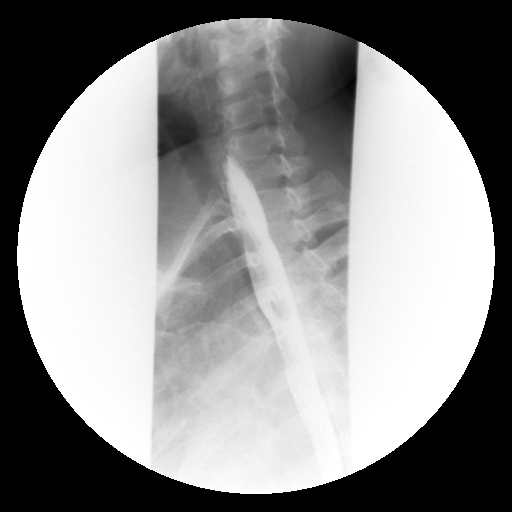

[20 of 21 positions shown; findings below may reference images not displayed]

FINDINGS: The oropharyngeal swallowing mechanisms are normal.
Valleculae and piriform sinuses are normal.  There is no mass
lesion, stricture, Pozisa diverticulum, or hiatal hernia.  There
is no esophagitis.  There is no gastroesophageal reflux.

A 13 mm barium tablet passed immediately from the mouth to the
stomach with no delay.
IMPRESSION: Normal barium esophagram.

## 2014-01-14 LAB — HM PAP SMEAR: HM Pap smear: NORMAL

## 2014-02-14 ENCOUNTER — Encounter: Payer: Self-pay | Admitting: Internal Medicine

## 2014-02-14 ENCOUNTER — Ambulatory Visit (INDEPENDENT_AMBULATORY_CARE_PROVIDER_SITE_OTHER): Payer: 59 | Admitting: Internal Medicine

## 2014-02-14 VITALS — BP 116/78 | HR 80 | Temp 98.0°F | Resp 16 | Ht 66.0 in | Wt 152.8 lb

## 2014-02-14 DIAGNOSIS — I10 Essential (primary) hypertension: Secondary | ICD-10-CM

## 2014-02-14 DIAGNOSIS — R634 Abnormal weight loss: Secondary | ICD-10-CM

## 2014-02-14 DIAGNOSIS — E038 Other specified hypothyroidism: Secondary | ICD-10-CM

## 2014-02-14 DIAGNOSIS — E032 Hypothyroidism due to medicaments and other exogenous substances: Secondary | ICD-10-CM

## 2014-02-14 DIAGNOSIS — N920 Excessive and frequent menstruation with regular cycle: Secondary | ICD-10-CM

## 2014-02-14 DIAGNOSIS — E034 Atrophy of thyroid (acquired): Secondary | ICD-10-CM

## 2014-02-14 DIAGNOSIS — E669 Obesity, unspecified: Secondary | ICD-10-CM

## 2014-02-14 LAB — COMPREHENSIVE METABOLIC PANEL
ALBUMIN: 4.2 g/dL (ref 3.5–5.2)
ALT: 18 U/L (ref 0–35)
AST: 20 U/L (ref 0–37)
Alkaline Phosphatase: 67 U/L (ref 39–117)
BUN: 17 mg/dL (ref 6–23)
CALCIUM: 9.2 mg/dL (ref 8.4–10.5)
CHLORIDE: 103 meq/L (ref 96–112)
CO2: 25 mEq/L (ref 19–32)
Creatinine, Ser: 0.9 mg/dL (ref 0.40–1.20)
GFR: 74.34 mL/min (ref >60.00)
Glucose, Bld: 95 mg/dL (ref 70–99)
Potassium: 3.9 mEq/L (ref 3.5–5.1)
SODIUM: 138 meq/L (ref 135–145)
Total Bilirubin: 0.7 mg/dL (ref 0.2–1.2)
Total Protein: 7.1 g/dL (ref 6.0–8.3)

## 2014-02-14 LAB — CBC WITH DIFFERENTIAL/PLATELET
BASOS PCT: 0.5 % (ref 0.0–3.0)
Basophils Absolute: 0 10*3/uL (ref 0.0–0.1)
Eosinophils Absolute: 0.1 10*3/uL (ref 0.0–0.7)
Eosinophils Relative: 1.5 % (ref 0.0–5.0)
HCT: 39.3 % (ref 36.0–46.0)
HEMOGLOBIN: 13.8 g/dL (ref 12.0–15.0)
Lymphocytes Relative: 12.9 % (ref 12.0–46.0)
Lymphs Abs: 0.7 10*3/uL (ref 0.7–4.0)
MCHC: 35.2 g/dL (ref 30.0–36.0)
MCV: 86.9 fl (ref 78.0–100.0)
MONOS PCT: 4.8 % (ref 3.0–12.0)
Monocytes Absolute: 0.3 10*3/uL (ref 0.1–1.0)
Neutro Abs: 4.3 10*3/uL (ref 1.4–7.7)
Neutrophils Relative %: 80.3 % — ABNORMAL HIGH (ref 43.0–77.0)
PLATELETS: 204 10*3/uL (ref 150.0–400.0)
RBC: 4.52 Mil/uL (ref 3.87–5.11)
RDW: 13.9 % (ref 11.5–15.5)
WBC: 5.3 10*3/uL (ref 4.0–10.5)

## 2014-02-14 LAB — TSH: TSH: 0.19 u[IU]/mL — ABNORMAL LOW (ref 0.35–4.50)

## 2014-02-14 LAB — FERRITIN: Ferritin: 48.4 ng/mL (ref 10.0–291.0)

## 2014-02-14 NOTE — Assessment & Plan Note (Signed)
BMI is now < 25.  Her goal wt is 140 lbs,  I have congratulated her in reduction of   BMI and encouraged  Continued weight loss with goal of 10% of body weigh over the next 6 months using a low glycemic index diet and regular exercise a minimum of 5 days per week.

## 2014-02-14 NOTE — Progress Notes (Signed)
Pre-visit discussion using our clinic review tool. No additional management support is needed unless otherwise documented below in the visit note.  

## 2014-02-14 NOTE — Patient Instructions (Addendum)
You are doing fantastic!  Checking thyROId and iron tday

## 2014-02-14 NOTE — Progress Notes (Addendum)
Patient ID: [name], female   DOB: [birth year], 39 y.o.   MRN: [id number]  Patient Active Problem List   Diagnosis Date Noted  . Obesity 02/14/2014  . Essential hypertension 11/14/2013  . Hypokalemia 11/14/2013  . Atypical squamous cells of undetermined significance (ASCUS) on Papanicolaou smear of cervix 02/04/2013  . Iatrogenic hypothyroidism 02/04/2013  . Encounter for preventive health examination 01/31/2013  . Graves disease 11/26/2012  . Abnormal mammogram 10/14/2012  . Colitis, nonspecific 10/03/2012  . Edema 10/03/2012  . Paroxysmal SVT (supraventricular tachycardia) 10/03/2012    Subjective:  CC:   Chief Complaint  Patient presents with  . Follow-up    3 month, thyroid and weight loss    HPI:   [name] is a 39 y.o. female who presents for Follow up on hypothyroidism and obesity. TSH was suppressed at last visit and levothyroxine dose was decreased.   Patient has been  Exercising 5 days per week and following a low carbohydrate diet.  Using phentermine now intermittently,  To manage appetite .  Has not plateaued yet and her goal  Is 140 lbs.  Feels good, no insomnia or palpitations.  No joints hurting.  Only complaint is hands feel cold all the time,  Sometimes have a bluish tint even in warm rooms.  Has heavy perids but periods are occurring at regular intervals.    Past Medical History  Diagnosis Date  . Arthritis   . Chicken pox   . Allergy   . Hypertension   . Thyroid disease   . Irritable bowel syndrome (IBS) 2004  . Colitis 2004  . Colon polyps   . Palpitations 2000    Past Surgical History  Procedure Laterality Date  . Cholecystectomy  2001  . Tonsilectomy/adenoidectomy with myringotomy    . Cesarean section  2006,2010  . Tubal ligation  2010  . Carpal tunnel release Left 2012    Two cyst removal       The following portions of the patient's history were reviewed and updated as appropriate: Allergies, current medications, and problem  list.    Review of Systems:   Patient denies headache, fevers, malaise, unintentional weight loss, skin rash, eye pain, sinus congestion and sinus pain, sore throat, dysphagia,  hemoptysis , cough, dyspnea, wheezing, chest pain, palpitations, orthopnea, edema, abdominal pain, nausea, melena, diarrhea, constipation, flank pain, dysuria, hematuria, urinary  Frequency, nocturia, numbness, tingling, seizures,  Focal weakness, Loss of consciousness,  Tremor, insomnia, depression, anxiety, and suicidal ideation.     History   Social History  . Marital Status: Married    Spouse Name: N/A    Number of Children: N/A  . Years of Education: N/A   Occupational History  . Not on file.   Social History Main Topics  . Smoking status: Never Smoker   . Smokeless tobacco: Never Used  . Alcohol Use: Yes  . Drug Use: No  . Sexual Activity:    Partners: Male   Other Topics Concern  . Not on file   Social History Narrative   Married: 2 kids: ages 71 and 4.5 yrs   Work: [employer]       Regular exercise: not at this time   Caffeine use: coffee in the AM: dt soda at night                Objective:  Filed Vitals:   02/14/14 0805  BP: 116/78  Pulse: 80  Temp: 98 F (36.7 C)  Resp:  16     General appearance: alert, cooperative and appears stated age Ears: normal TM's and external ear canals both ears Throat: lips, mucosa, and tongue normal; teeth and gums normal Neck: no adenopathy, no carotid bruit, supple, symmetrical, trachea midline and thyroid not enlarged, symmetric, no tenderness/mass/nodules Back: symmetric, no curvature. ROM normal. No CVA tenderness. Lungs: clear to auscultation bilaterally Heart: regular rate and rhythm, S1, S2 normal, no murmur, click, rub or gallop Abdomen: soft, non-tender; bowel sounds normal; no masses,  no organomegaly Pulses: 2+ and symmetric Skin: Skin color, texture, turgor normal. No rashes or lesions Lymph nodes: Cervical,  supraclavicular, and axillary nodes normal.  Assessment and Plan:  Iatrogenic hypothyroidism Repeat TSH is improved after dose reduction but still suppressed.  Since is symptomatic ,  Will reduce  dose and repeat TSH and Free T4  in 3 months.   Lab Results  Component Value Date   TSH 0.19* 02/14/2014      Obesity BMI is now < 25.  Her goal wt is 140 lbs,  I have congratulated her in reduction of   BMI and encouraged  Continued weight loss with goal of 10% of body weigh over the next 6 months using a low glycemic index diet and regular exercise a minimum of 5 days per week.     Essential hypertension Well controlled on current regimen. Renal function stable, and lytes are normal , no changes today.  Lab Results  Component Value Date   CREATININE 0.90 02/14/2014   Lab Results  Component Value Date   NA 138 02/14/2014   K 3.9 02/14/2014   CL 103 02/14/2014   CO2 25 02/14/2014        Updated Medication List Outpatient Encounter Prescriptions as of 02/14/2014  Medication Sig  . fexofenadine (ALLEGRA) 180 MG tablet Take 180 mg by mouth daily.  . fluticasone (FLONASE) 50 MCG/ACT nasal spray Place 2 sprays into the nose daily.  Marland Kitchen levothyroxine (SYNTHROID, LEVOTHROID) 175 MCG tablet Take 1 tablet (175 mcg total) by mouth daily before breakfast.  . methocarbamol (ROBAXIN) 750 MG tablet Take 1 tablet (750 mg total) by mouth 4 (four) times daily as needed.  . phentermine (ADIPEX-P) 37.5 MG tablet Take 0.5 tablets (18.75 mg total) by mouth 2 (two) times daily.  . propranolol (INDERAL) 20 MG tablet Take 1 tablet (20 mg total) by mouth 3 (three) times daily. As needed for SVT  . triamterene-hydrochlorothiazide (MAXZIDE-25) 37.5-25 MG per tablet Take 1 tablet by mouth daily.  . [DISCONTINUED] levothyroxine (SYNTHROID) 200 MCG tablet Take 1 tablet (200 mcg total) by mouth daily before breakfast.  . potassium chloride SA (K-DUR,KLOR-CON) 20 MEQ tablet Take 1 tablet (20 mEq total) by  mouth daily. (Patient not taking: Reported on 02/14/2014)  . [DISCONTINUED] hydrochlorothiazide (HYDRODIURIL) 25 MG tablet Take 1 tablet by mouth  daily (Patient not taking: Reported on 02/14/2014)     Orders Placed This Encounter  Procedures  . CBC with Differential/Platelet  . Ferritin  . Iron and TIBC  . TSH  . Comp Met (CMET)  . HM PAP SMEAR  . HM PAP SMEAR    No Follow-up on file.

## 2014-02-14 NOTE — Assessment & Plan Note (Addendum)
Repeat TSH is improved after dose reduction but still suppressed.  Since is symptomatic ,  Will reduce  dose and repeat TSH and Free T4  in 3 months.   Lab Results  Component Value Date   TSH 0.19* 02/14/2014

## 2014-02-14 NOTE — Assessment & Plan Note (Signed)
Well controlled on current regimen. Renal function stable, and lytes are normal , no changes today.  Lab Results  Component Value Date   CREATININE 0.90 02/14/2014   Lab Results  Component Value Date   NA 138 02/14/2014   K 3.9 02/14/2014   CL 103 02/14/2014   CO2 25 02/14/2014

## 2014-02-15 ENCOUNTER — Other Ambulatory Visit: Payer: Self-pay | Admitting: Internal Medicine

## 2014-02-15 ENCOUNTER — Encounter: Payer: Self-pay | Admitting: Internal Medicine

## 2014-02-15 DIAGNOSIS — E032 Hypothyroidism due to medicaments and other exogenous substances: Secondary | ICD-10-CM

## 2014-02-15 LAB — IRON AND TIBC
%SAT: 16 % — ABNORMAL LOW (ref 20–55)
Iron: 54 ug/dL (ref 42–145)
TIBC: 334 ug/dL (ref 250–470)
UIBC: 280 ug/dL (ref 125–400)

## 2014-02-15 MED ORDER — LEVOTHYROXINE SODIUM 175 MCG PO TABS: 188.0000 ug | ORAL_TABLET | Freq: Every day | ORAL | Status: DC

## 2014-02-15 NOTE — Addendum Note (Signed)
Addended by: Sherlene ShamsULLO, Zaivion Kundrat L on: 02/15/2014 08:29 AM   Modules accepted: Orders

## 2014-02-18 ENCOUNTER — Telehealth: Payer: Self-pay | Admitting: Internal Medicine

## 2014-02-18 NOTE — Telephone Encounter (Signed)
emmi emailed °

## 2014-03-21 ENCOUNTER — Other Ambulatory Visit: Payer: Self-pay | Admitting: Internal Medicine

## 2014-05-11 ENCOUNTER — Other Ambulatory Visit: Payer: Self-pay | Admitting: Internal Medicine

## 2014-05-24 ENCOUNTER — Encounter: Payer: Self-pay | Admitting: Internal Medicine

## 2014-05-24 ENCOUNTER — Ambulatory Visit (INDEPENDENT_AMBULATORY_CARE_PROVIDER_SITE_OTHER): Payer: 59 | Admitting: Internal Medicine

## 2014-05-24 VITALS — BP 104/68 | HR 61 | Temp 98.0°F | Resp 14 | Ht 65.0 in | Wt 150.0 lb

## 2014-05-24 DIAGNOSIS — E032 Hypothyroidism due to medicaments and other exogenous substances: Secondary | ICD-10-CM

## 2014-05-24 DIAGNOSIS — E669 Obesity, unspecified: Secondary | ICD-10-CM | POA: Diagnosis not present

## 2014-05-24 NOTE — Patient Instructions (Addendum)
Premier Protein chocolate shakes  Breyer's carb smart fudsicle  MIni sweet peppers (25 cars for 3,  4 carbs)   KIND bars (the < 5 g sugar variety )  Avoid oatmeal on a regular basis,  Too many carbs,  Not enough protein   GET BACK TO EXERCISING!!!   Try the WASA CRACKERS.  They are LOW CARB   LOTS OF VARIETIES ,  very few ingredients

## 2014-05-24 NOTE — Progress Notes (Signed)
Patient ID: Tracy MercuryKori L Geathers, female   DOB: 08/29/1975, 39 y.o.   MRN: [id number]

## 2014-05-24 NOTE — Progress Notes (Signed)
Pre-visit discussion using our clinic review tool. No additional management support is needed unless otherwise documented below in the visit note.  

## 2014-05-26 ENCOUNTER — Encounter: Payer: Self-pay | Admitting: Internal Medicine

## 2014-05-26 ENCOUNTER — Other Ambulatory Visit: Payer: Self-pay | Admitting: Internal Medicine

## 2014-05-26 DIAGNOSIS — E032 Hypothyroidism due to medicaments and other exogenous substances: Secondary | ICD-10-CM

## 2014-05-26 LAB — T4 AND TSH
T4, Total: 9.6 ug/dL (ref 4.5–12.0)
TSH: 6.76 u[IU]/mL — ABNORMAL HIGH (ref 0.450–4.500)

## 2014-05-26 MED ORDER — LEVOTHYROXINE SODIUM 200 MCG PO TABS: 188.0000 ug | ORAL_TABLET | Freq: Every day | ORAL | Status: DC

## 2014-05-26 NOTE — Progress Notes (Signed)
Subjective:  Patient ID: [name], female    DOB: 08/16/1975  Age: 39 y.o. MRN: [id number]  CC:   HPI [name] presents for follow up on WEIGHT LOSS AND hypothyroidism.    Wt Readings from Last 3 Encounters:  05/24/14 150 lb (68.04 kg)  02/14/14 152 lb 12 oz (69.287 kg)  11/12/13 169 lb 4 oz (76.771 kg)    Wt loss has been less significant since she stopped  Using daily phentermine  6 weeks ag.  Still following a 1200 cal diet most days but due to daugher's dance schedule she has reduced her xercise regimen schedule from 5 days per week  To once a week.  She is now wearing a size 6,  Which is less than her high school size,9  But wants to lose 6 more lbs to give her some latitutude with diet , because [place]  [place]"  .  Needs thyroid rechecked after last dose reduction.    Outpatient Prescriptions Prior to Visit  Medication Sig Dispense Refill  . fexofenadine (ALLEGRA) 180 MG tablet Take 180 mg by mouth daily.    . fluticasone (FLONASE) 50 MCG/ACT nasal spray Place 2 sprays into the nose daily.    . methocarbamol (ROBAXIN) 750 MG tablet TAKE 1 TABLET BY MOUTH FOUR TIMES DAILY AS NEEDED 90 tablet 0  . potassium chloride SA (K-DUR,KLOR-CON) 20 MEQ tablet Take 1 tablet (20 mEq total) by mouth daily. 7 tablet 0  . propranolol (INDERAL) 20 MG tablet Take 1 tablet (20 mg total) by mouth 3 (three) times daily. As needed for SVT 90 tablet 3  . triamterene-hydrochlorothiazide (MAXZIDE-25) 37.5-25 MG per tablet Take 1 tablet by mouth  daily 90 tablet 1  . levothyroxine (SYNTHROID, LEVOTHROID) 175 MCG tablet Take 1 tablet (175 mcg total) by mouth daily before breakfast. 90 tablet 0  . phentermine (ADIPEX-P) 37.5 MG tablet Take 0.5 tablets (18.75 mg total) by mouth 2 (two) times daily. (Patient not taking: Reported on 05/24/2014) 30 tablet 2   No facility-administered medications prior to visit.    Review of Systems;  Patient denies headache,  fevers, malaise, unintentional weight loss, skin rash, eye pain, sinus congestion and sinus pain, sore throat, dysphagia,  hemoptysis , cough, dyspnea, wheezing, chest pain, palpitations, orthopnea, edema, abdominal pain, nausea, melena, diarrhea, constipation, flank pain, dysuria, hematuria, urinary  Frequency, nocturia, numbness, tingling, seizures,  Focal weakness, Loss of consciousness,  Tremor, insomnia, depression, anxiety, and suicidal ideation.      Objective:  BP 104/68 mmHg  Pulse 61  Temp(Src) 98 F (36.7 C) (Oral)  Resp 14  Ht 5\' 5"  (1.651 m)  Wt 150 lb (68.04 kg)  BMI 24.96 kg/m2  SpO2 100%  BP Readings from Last 3 Encounters:  05/24/14 104/68  02/14/14 116/78  11/12/13 126/78    Wt Readings from Last 3 Encounters:  05/24/14 150 lb (68.04 kg)  02/14/14 152 lb 12 oz (69.287 kg)  11/12/13 169 lb 4 oz (76.771 kg)    General appearance: alert, cooperative and appears stated age Ears: normal TM's and external ear canals both ears Throat: lips, mucosa, and tongue normal; teeth and gums normal Neck: no adenopathy, no carotid bruit, supple, symmetrical, trachea midline and thyroid not enlarged, symmetric, no tenderness/mass/nodules Back: symmetric, no curvature. ROM normal. No CVA tenderness. Lungs: clear to auscultation bilaterally Heart: regular rate and rhythm, S1, S2 normal, no murmur, click, rub or gallop Abdomen:  soft, non-tender; bowel sounds normal; no masses,  no organomegaly Pulses: 2+ and symmetric Skin: Skin color, texture, turgor normal. No rashes or lesions Lymph nodes: Cervical, supraclavicular, and axillary nodes normal.  No results found for: HGBA1C  Lab Results  Component Value Date   CREATININE 0.90 02/14/2014   CREATININE 0.61 11/12/2013   CREATININE 0.7 08/10/2013    Lab Results  Component Value Date   WBC 5.3 02/14/2014   HGB 13.8 02/14/2014   HCT 39.3 02/14/2014   PLT 204.0 02/14/2014   GLUCOSE 95 02/14/2014   CHOL 136 04/30/2013    TRIG 69 04/30/2013   HDL 48 04/30/2013   LDLCALC 74 04/30/2013   ALT 18 02/14/2014   AST 20 02/14/2014   NA 138 02/14/2014   K 3.9 02/14/2014   CL 103 02/14/2014   CREATININE 0.90 02/14/2014   BUN 17 02/14/2014   CO2 25 02/14/2014   TSH 6.760* 05/24/2014   MICROALBUR 0.2 10/02/2012    No results found.  Assessment & Plan:   Problem List Items Addressed This Visit    Iatrogenic hypothyroidism    Thyroid function is underactive on current dose.  I have sent a higher dose of levothyroxine to her pharmacy and would like patient to change immediatley.   Lab Results  Component Value Date   TSH 6.760* 05/24/2014         Relevant Medications   levothyroxine (LEVOXYL) 200 MCG tablet   Obesity - Primary    I have congratulated her in reduction of   BMI and encouraged  Continued weight loss with goal of 10% of body weigh over the next 6 months using a low glycemic index diet and regular exercise a minimum of 5 days per week.         A total of 25 minutes of face to face time was spent with patient more than half of which was spent in counselling about the above mentioned conditions  and coordination of care   I have discontinued Ms. Miklos's phentermine. I have also changed her levothyroxine. Additionally, I am having her maintain her fexofenadine, fluticasone, propranolol, potassium chloride SA, triamterene-hydrochlorothiazide, and methocarbamol.  Meds ordered this encounter  Medications  . levothyroxine (LEVOXYL) 200 MCG tablet    Sig: Take 1 tablet (200 mcg total) by mouth daily before breakfast.    Dispense:  90 tablet    Refill:  1    Medications Discontinued During This Encounter  Medication Reason  . levothyroxine (SYNTHROID, LEVOTHROID) 175 MCG tablet Reorder  . phentermine (ADIPEX-P) 37.5 MG tablet     Follow-up: No Follow-up on file.   Sherlene ShamsULLO, TERESA L, MD

## 2014-05-26 NOTE — Assessment & Plan Note (Signed)
I have congratulated her in reduction of   BMI and encouraged  Continued weight loss with goal of 10% of body weigh over the next 6 months using a low glycemic index diet and regular exercise a minimum of 5 days per week.    

## 2014-05-26 NOTE — Assessment & Plan Note (Signed)
Thyroid function is underactive on current dose.  I have sent a higher dose of levothyroxine to her pharmacy and would like patient to change immediatley.   Lab Results  Component Value Date   TSH 6.760* 05/24/2014

## 2014-07-04 ENCOUNTER — Other Ambulatory Visit (INDEPENDENT_AMBULATORY_CARE_PROVIDER_SITE_OTHER): Payer: 59

## 2014-07-04 DIAGNOSIS — E032 Hypothyroidism due to medicaments and other exogenous substances: Secondary | ICD-10-CM | POA: Diagnosis not present

## 2014-07-05 ENCOUNTER — Other Ambulatory Visit: Payer: 59

## 2014-07-05 LAB — T4 AND TSH
T4, Total: 9.9 ug/dL (ref 4.5–12.0)
TSH: 4.61 u[IU]/mL — ABNORMAL HIGH (ref 0.450–4.500)

## 2014-07-07 ENCOUNTER — Other Ambulatory Visit: Payer: Self-pay | Admitting: Internal Medicine

## 2014-07-07 ENCOUNTER — Encounter: Payer: Self-pay | Admitting: Internal Medicine

## 2014-07-07 DIAGNOSIS — E032 Hypothyroidism due to medicaments and other exogenous substances: Secondary | ICD-10-CM

## 2014-07-07 MED ORDER — LEVOTHYROXINE SODIUM 200 MCG PO TABS: 200.0000 ug | ORAL_TABLET | Freq: Every day | ORAL | Status: DC

## 2014-07-07 MED ORDER — LEVOTHYROXINE SODIUM 25 MCG PO TABS: 25.0000 ug | ORAL_TABLET | Freq: Every day | ORAL | Status: DC

## 2014-08-17 ENCOUNTER — Other Ambulatory Visit (INDEPENDENT_AMBULATORY_CARE_PROVIDER_SITE_OTHER): Payer: 59

## 2014-08-17 DIAGNOSIS — E032 Hypothyroidism due to medicaments and other exogenous substances: Secondary | ICD-10-CM | POA: Diagnosis not present

## 2014-08-18 LAB — T4 AND TSH
T4, Total: 10.8 ug/dL (ref 4.5–12.0)
TSH: 7.2 u[IU]/mL — ABNORMAL HIGH (ref 0.450–4.500)

## 2014-08-19 ENCOUNTER — Encounter: Payer: Self-pay | Admitting: Internal Medicine

## 2014-08-20 ENCOUNTER — Other Ambulatory Visit: Payer: Self-pay | Admitting: Internal Medicine

## 2014-08-20 DIAGNOSIS — E032 Hypothyroidism due to medicaments and other exogenous substances: Secondary | ICD-10-CM

## 2014-08-20 MED ORDER — LEVOTHYROXINE SODIUM 50 MCG PO TABS: 50.0000 ug | ORAL_TABLET | Freq: Every day | ORAL | Status: DC

## 2014-08-20 NOTE — Assessment & Plan Note (Signed)
Thyroid is underactive on current dose of 225 mcg daily.   Dose increased to 250 mcg daily.  Repeat TSH in 6 weeks.

## 2014-08-22 ENCOUNTER — Other Ambulatory Visit: Payer: Self-pay | Admitting: Internal Medicine

## 2014-08-22 DIAGNOSIS — E038 Other specified hypothyroidism: Secondary | ICD-10-CM

## 2014-08-22 NOTE — Telephone Encounter (Signed)
Recheck TSH in 6 weeks,

## 2014-09-19 ENCOUNTER — Other Ambulatory Visit: Payer: Self-pay | Admitting: Internal Medicine

## 2014-09-26 ENCOUNTER — Other Ambulatory Visit (INDEPENDENT_AMBULATORY_CARE_PROVIDER_SITE_OTHER): Payer: 59

## 2014-09-26 DIAGNOSIS — E038 Other specified hypothyroidism: Secondary | ICD-10-CM

## 2014-09-27 LAB — T4 AND TSH
T4, Total: 12.2 ug/dL — ABNORMAL HIGH (ref 4.5–12.0)
TSH: 7.88 u[IU]/mL — AB (ref 0.450–4.500)

## 2014-09-28 ENCOUNTER — Encounter: Payer: Self-pay | Admitting: Internal Medicine

## 2014-09-29 ENCOUNTER — Encounter: Payer: Self-pay | Admitting: Internal Medicine

## 2014-09-29 ENCOUNTER — Telehealth: Payer: Self-pay | Admitting: *Deleted

## 2014-09-29 DIAGNOSIS — E034 Atrophy of thyroid (acquired): Secondary | ICD-10-CM

## 2014-09-29 NOTE — Telephone Encounter (Signed)
Patient is having palpitations and resting heart rate is between 50-60 also having aching joints in bed by eight every night and she is just feeling worse. Patient is wanting to see endocrinology, patient stated she has gained 15 pounds and can't get it off. Patient wants referral to [facility] for endocrinology called the [facility] endocrinology and diabetic clinic. Patient taking 250 mcg and hour before eating of levothyroxine.

## 2014-09-29 NOTE — Telephone Encounter (Signed)
Patient has requested a call back from Dr.Tullo to discuss her tyroid.-Thanks

## 2014-09-30 NOTE — Telephone Encounter (Signed)
I do not recommend increasing her dose because her T4 is elevated and she is already on a pretty high dose.  The referral to [facility] is a good idea, and I have entered it and e mailed her to let her know the referral is pending

## 2014-09-30 NOTE — Telephone Encounter (Signed)
Patient notified

## 2014-10-10 ENCOUNTER — Encounter: Payer: Self-pay | Admitting: Internal Medicine

## 2014-10-25 ENCOUNTER — Other Ambulatory Visit: Payer: Self-pay | Admitting: Internal Medicine

## 2014-10-25 NOTE — Telephone Encounter (Signed)
Please advise 

## 2014-10-26 NOTE — Telephone Encounter (Signed)
Ok to refill,  Refill sent  

## 2014-10-28 LAB — HM MAMMOGRAPHY: HM Mammogram: NEGATIVE

## 2014-10-31 ENCOUNTER — Encounter: Payer: Self-pay | Admitting: Internal Medicine

## 2014-11-27 ENCOUNTER — Encounter: Payer: Self-pay | Admitting: Internal Medicine

## 2014-11-27 DIAGNOSIS — E033 Postinfectious hypothyroidism: Secondary | ICD-10-CM

## 2014-11-30 ENCOUNTER — Other Ambulatory Visit (INDEPENDENT_AMBULATORY_CARE_PROVIDER_SITE_OTHER): Payer: 59

## 2014-11-30 DIAGNOSIS — E033 Postinfectious hypothyroidism: Secondary | ICD-10-CM | POA: Diagnosis not present

## 2014-11-30 LAB — T3, FREE: T3 FREE: 3.2 pg/mL (ref 2.3–4.2)

## 2014-12-01 LAB — T4 AND TSH
T4, Total: 11.1 ug/dL (ref 4.5–12.0)
TSH: 9.13 u[IU]/mL — AB (ref 0.450–4.500)

## 2014-12-02 ENCOUNTER — Encounter: Payer: Self-pay | Admitting: Internal Medicine

## 2014-12-05 ENCOUNTER — Encounter: Payer: Self-pay | Admitting: Internal Medicine

## 2015-01-21 ENCOUNTER — Encounter: Payer: Self-pay | Admitting: Internal Medicine

## 2015-01-21 DIAGNOSIS — E876 Hypokalemia: Secondary | ICD-10-CM

## 2015-01-21 DIAGNOSIS — E0500 Thyrotoxicosis with diffuse goiter without thyrotoxic crisis or storm: Secondary | ICD-10-CM

## 2015-01-30 ENCOUNTER — Other Ambulatory Visit (INDEPENDENT_AMBULATORY_CARE_PROVIDER_SITE_OTHER): Payer: 59

## 2015-01-30 DIAGNOSIS — E876 Hypokalemia: Secondary | ICD-10-CM | POA: Diagnosis not present

## 2015-01-30 DIAGNOSIS — E0500 Thyrotoxicosis with diffuse goiter without thyrotoxic crisis or storm: Secondary | ICD-10-CM | POA: Diagnosis not present

## 2015-01-31 LAB — COMPREHENSIVE METABOLIC PANEL
ALBUMIN: 3.7 g/dL (ref 3.5–5.2)
ALK PHOS: 28 U/L — AB (ref 39–117)
ALT: 10 U/L (ref 0–35)
AST: 12 U/L (ref 0–37)
BILIRUBIN TOTAL: 1 mg/dL (ref 0.2–1.2)
BUN: 10 mg/dL (ref 6–23)
CALCIUM: 8.7 mg/dL (ref 8.4–10.5)
CO2: 31 mEq/L (ref 19–32)
CREATININE: 0.58 mg/dL (ref 0.40–1.20)
Chloride: 96 mEq/L (ref 96–112)
GFR: 122.82 mL/min (ref >60.00)
Glucose, Bld: 84 mg/dL (ref 70–99)
Potassium: 3.1 mEq/L — ABNORMAL LOW (ref 3.5–5.1)
Sodium: 133 mEq/L — ABNORMAL LOW (ref 135–145)
TOTAL PROTEIN: 5.5 g/dL — AB (ref 6.0–8.3)

## 2015-01-31 LAB — T3, FREE: T3, Free: 2.6 pg/mL (ref 2.3–4.2)

## 2015-01-31 LAB — T4 AND TSH
T4, Total: 10.6 ug/dL (ref 4.5–12.0)
TSH: 0.752 u[IU]/mL (ref 0.450–4.500)

## 2015-02-01 ENCOUNTER — Encounter: Payer: Self-pay | Admitting: Internal Medicine

## 2015-02-01 ENCOUNTER — Other Ambulatory Visit: Payer: Self-pay | Admitting: Internal Medicine

## 2015-02-01 DIAGNOSIS — E876 Hypokalemia: Secondary | ICD-10-CM

## 2015-02-01 MED ORDER — POTASSIUM CHLORIDE CRYS ER 20 MEQ PO TBCR: 20.0000 meq | EXTENDED_RELEASE_TABLET | Freq: Every day | ORAL | Status: DC

## 2015-03-13 ENCOUNTER — Other Ambulatory Visit: Payer: Self-pay | Admitting: Obstetrics and Gynecology

## 2015-03-27 NOTE — Patient Instructions (Signed)
Your procedure is scheduled on:  Friday, April 07, 2015  Enter through the Main Entrance of [facility] at:  11:30 AM  Pick up the phone at the desk and dial [phone].  Call this number if you have problems the morning of surgery: [phone].  Remember:  Do NOT eat food:  After Midnight Thursday  Do NOT drink clear liquids after:  9:00 AM day of surgery  Take these medicines the morning of surgery with a SIP OF WATER:  Triamterene, Levothyroxine, Potassium  Do NOT wear jewelry (body piercing), metal hair clips/bobby pins, make-up, or nail polish. Do NOT wear lotions, powders, or perfumes.  You may wear deodorant. Do NOT shave for 48 hours prior to surgery. Do NOT bring valuables to the hospital. Contacts, dentures, or bridgework may not be worn into surgery.  Have a responsible adult drive you home and stay with you for 24 hours after your procedure

## 2015-03-29 ENCOUNTER — Inpatient Hospital Stay (HOSPITAL_COMMUNITY): Admission: RE | Admit: 2015-03-29 | Payer: 59 | Source: Ambulatory Visit

## 2015-03-31 ENCOUNTER — Encounter (HOSPITAL_COMMUNITY)
Admission: RE | Admit: 2015-03-31 | Discharge: 2015-03-31 | Disposition: A | Discharge status: Home / Self Care | Payer: 59 | Source: Ambulatory Visit | Attending: Obstetrics and Gynecology | Admitting: Obstetrics and Gynecology

## 2015-03-31 ENCOUNTER — Encounter (HOSPITAL_COMMUNITY): Payer: Self-pay

## 2015-03-31 DIAGNOSIS — Z01812 Encounter for preprocedural laboratory examination: Secondary | ICD-10-CM | POA: Diagnosis not present

## 2015-03-31 HISTORY — DX: Thyrotoxicosis with diffuse goiter without thyrotoxic crisis or storm: E05.00

## 2015-03-31 HISTORY — DX: Bradycardia, unspecified: R00.1

## 2015-03-31 HISTORY — DX: Hypothyroidism, unspecified: E03.9

## 2015-03-31 HISTORY — DX: Cardiac murmur, unspecified: R01.1

## 2015-03-31 HISTORY — DX: Headache, unspecified: R51.9

## 2015-03-31 HISTORY — DX: Nonrheumatic mitral (valve) prolapse: I34.1

## 2015-03-31 HISTORY — DX: Other specified soft tissue disorders: M79.89

## 2015-03-31 HISTORY — DX: Nausea with vomiting, unspecified: R11.2

## 2015-03-31 HISTORY — DX: Other specified postprocedural states: Z98.890

## 2015-03-31 HISTORY — DX: Cardiac arrhythmia, unspecified: I49.9

## 2015-03-31 HISTORY — DX: Headache: R51

## 2015-03-31 HISTORY — DX: Unspecified asthma, uncomplicated: J45.909

## 2015-03-31 LAB — BASIC METABOLIC PANEL
Anion gap: 7 (ref 5–15)
BUN: 10 mg/dL (ref 6–20)
CHLORIDE: 98 mmol/L — AB (ref 101–111)
CO2: 30 mmol/L (ref 22–32)
Calcium: 8.1 mg/dL — ABNORMAL LOW (ref 8.9–10.3)
Creatinine, Ser: 0.55 mg/dL (ref 0.44–1.00)
GFR calc Af Amer: >60 mL/min (ref >60)
GFR calc non Af Amer: >60 mL/min (ref >60)
Glucose, Bld: 85 mg/dL (ref 65–99)
POTASSIUM: 3.4 mmol/L — AB (ref 3.5–5.1)
SODIUM: 135 mmol/L (ref 135–145)

## 2015-03-31 LAB — CBC
HEMATOCRIT: 37.6 % (ref 36.0–46.0)
HEMOGLOBIN: 14.1 g/dL (ref 12.0–15.0)
MCH: 31.8 pg (ref 26.0–34.0)
MCHC: 37.5 g/dL — ABNORMAL HIGH (ref 30.0–36.0)
MCV: 84.9 fL (ref 78.0–100.0)
Platelets: 197 10*3/uL (ref 150–400)
RBC: 4.43 MIL/uL (ref 3.87–5.11)
RDW: 13.3 % (ref 11.5–15.5)
WBC: 7.4 10*3/uL (ref 4.0–10.5)

## 2015-04-06 NOTE — H&P (Signed)
NAMPatrici Higgins:  Higgins, Tracy                  ACCOUNT NO.:  [id number]  MEDICAL RECORD NO.:  [id number]  LOCATION:  [facility]                         FACILITY:  [facility]  PHYSICIAN:  [name], M.D.DATE OF BIRTH:  1975/03/09  DATE OF ADMISSION:  03/08/2015 DATE OF DISCHARGE:                             HISTORY & PHYSICAL   PREOPERATIVE DIAGNOSIS:  Refractory menorrhagia.  HISTORY OF PRESENT ILLNESS:  The patient is a 40 year old white female, G2, P2, who presents for definitive therapy.  MEDICATIONS:  Include thyroid medication and hydrochlorothiazide.  PAST MEDICAL PROBLEMS:  Include hypothyroidism with a history of Graves disease, dysmenorrhea, menorrhagia, history of tubal ligation, history of hypertension.  ALLERGIES:  None.  FAMILY HISTORY:  Cerebrovascular disease, heart disease, breast cancer, diabetes, chronic hypertension, and COPD.  SURGICAL HISTORY:  She has history of 2 pregnancies, 1 C-section, history of carpal tunnel surgery, hand surgery, tubal ligation, cholecystectomy, and tonsillectomy.  PHYSICAL EXAMINATION:  GENERAL:  A well-developed, well-nourished white female, in no acute distress. HEENT:  Normal. NECK:  Supple.  Full range of motion. LUNGS:  Clear. HEART:  Regular rate and rhythm. ABDOMEN:  Soft, nontender. PELVIC:  Revealed a retroflexed uterus and no adnexal masses. EXTREMITIES:  There are no cords. NEUROLOGIC:  Nonfocal. SKIN:  Intact.  IMPRESSION:  Refractory menorrhagia, for definitive therapy.  PLAN:  Diagnostic hysteroscopy, D and C, and NovaSure.  Risks of anesthesia, infection, bleeding, injury to surrounding organs with possible need for repair were discussed.  Delayed versus immediate complications to include bowel and bladder injury were noted.  The patient acknowledges and wishes to proceed.     [name], M.D.     RJT/MEDQ  D:  04/06/2015  T:  04/06/2015  Job:  [id number]

## 2015-04-07 ENCOUNTER — Ambulatory Visit (HOSPITAL_COMMUNITY): Payer: 59 | Admitting: Anesthesiology

## 2015-04-07 ENCOUNTER — Encounter (HOSPITAL_COMMUNITY)
Admission: RE | Disposition: A | Discharge status: Home / Self Care | Payer: Self-pay | Source: Ambulatory Visit | Attending: Obstetrics and Gynecology

## 2015-04-07 ENCOUNTER — Ambulatory Visit (HOSPITAL_COMMUNITY)
Admission: RE | Admit: 2015-04-07 | Discharge: 2015-04-07 | Disposition: A | Discharge status: Home / Self Care | Payer: 59 | Source: Ambulatory Visit | Attending: Obstetrics and Gynecology | Admitting: Obstetrics and Gynecology

## 2015-04-07 ENCOUNTER — Encounter (HOSPITAL_COMMUNITY): Payer: Self-pay

## 2015-04-07 DIAGNOSIS — I10 Essential (primary) hypertension: Secondary | ICD-10-CM | POA: Diagnosis not present

## 2015-04-07 DIAGNOSIS — N840 Polyp of corpus uteri: Secondary | ICD-10-CM | POA: Diagnosis not present

## 2015-04-07 DIAGNOSIS — E039 Hypothyroidism, unspecified: Secondary | ICD-10-CM | POA: Diagnosis not present

## 2015-04-07 DIAGNOSIS — J45909 Unspecified asthma, uncomplicated: Secondary | ICD-10-CM | POA: Diagnosis not present

## 2015-04-07 DIAGNOSIS — E0500 Thyrotoxicosis with diffuse goiter without thyrotoxic crisis or storm: Secondary | ICD-10-CM | POA: Diagnosis not present

## 2015-04-07 DIAGNOSIS — N946 Dysmenorrhea, unspecified: Secondary | ICD-10-CM | POA: Diagnosis not present

## 2015-04-07 DIAGNOSIS — N920 Excessive and frequent menstruation with regular cycle: Secondary | ICD-10-CM | POA: Diagnosis present

## 2015-04-07 DIAGNOSIS — Z9851 Tubal ligation status: Secondary | ICD-10-CM | POA: Diagnosis not present

## 2015-04-07 HISTORY — PX: DILITATION & CURRETTAGE/HYSTROSCOPY WITH NOVASURE ABLATION: SHX5568

## 2015-04-07 SURGERY — DILATATION & CURETTAGE/HYSTEROSCOPY WITH NOVASURE ABLATION
Anesthesia: General | Site: Vagina

## 2015-04-07 MED ORDER — ONDANSETRON HCL 4 MG/2ML IJ SOLN
INTRAMUSCULAR | Status: DC | PRN
  Administered 2015-04-07: 4 mg via INTRAVENOUS

## 2015-04-07 MED ORDER — FENTANYL CITRATE (PF) 100 MCG/2ML IJ SOLN
INTRAMUSCULAR | Status: AC
  Filled 2015-04-07: qty 2

## 2015-04-07 MED ORDER — GLYCOPYRROLATE 0.2 MG/ML IJ SOLN
INTRAMUSCULAR | Status: AC
  Filled 2015-04-07: qty 1

## 2015-04-07 MED ORDER — LACTATED RINGERS IV SOLN
INTRAVENOUS | Status: DC
  Administered 2015-04-07 (×3): via INTRAVENOUS

## 2015-04-07 MED ORDER — FENTANYL CITRATE (PF) 100 MCG/2ML IJ SOLN
25.0000 ug | INTRAMUSCULAR | Status: DC | PRN
  Administered 2015-04-07 (×2): 50 ug via INTRAVENOUS
  Administered 2015-04-07 (×2): 25 ug via INTRAVENOUS

## 2015-04-07 MED ORDER — DEXAMETHASONE SODIUM PHOSPHATE 10 MG/ML IJ SOLN
INTRAMUSCULAR | Status: AC
  Filled 2015-04-07: qty 1

## 2015-04-07 MED ORDER — VASOPRESSIN 20 UNIT/ML IV SOLN
INTRAVENOUS | Status: AC
Start: 2015-04-07 — End: 2015-04-07
  Filled 2015-04-07: qty 1

## 2015-04-07 MED ORDER — KETOROLAC TROMETHAMINE 30 MG/ML IJ SOLN
INTRAMUSCULAR | Status: DC | PRN
  Administered 2015-04-07: 30 mg via INTRAVENOUS

## 2015-04-07 MED ORDER — LACTATED RINGERS IR SOLN
Status: DC | PRN
  Administered 2015-04-07: 3000 mL

## 2015-04-07 MED ORDER — ONDANSETRON HCL 4 MG/2ML IJ SOLN
INTRAMUSCULAR | Status: AC
  Filled 2015-04-07: qty 2

## 2015-04-07 MED ORDER — SCOPOLAMINE 1 MG/3DAYS TD PT72
1.0000 | MEDICATED_PATCH | Freq: Once | TRANSDERMAL | Status: DC
  Administered 2015-04-07: 1.5 mg via TRANSDERMAL

## 2015-04-07 MED ORDER — HYDROCODONE-IBUPROFEN 7.5-200 MG PO TABS: 1.0000 | ORAL_TABLET | Freq: Three times a day (TID) | ORAL | Status: DC | PRN

## 2015-04-07 MED ORDER — DEXTROSE 5 % IV SOLN
2.0000 g | INTRAVENOUS | Status: AC
  Administered 2015-04-07: 2 g via INTRAVENOUS
  Filled 2015-04-07: qty 20

## 2015-04-07 MED ORDER — MEPERIDINE HCL 25 MG/ML IJ SOLN: 6.2500 mg | INTRAMUSCULAR | Status: DC | PRN

## 2015-04-07 MED ORDER — BUPIVACAINE HCL (PF) 0.25 % IJ SOLN
INTRAMUSCULAR | Status: DC | PRN
  Administered 2015-04-07: 20 mL

## 2015-04-07 MED ORDER — HYDROCODONE-ACETAMINOPHEN 7.5-325 MG PO TABS
ORAL_TABLET | ORAL | Status: AC
  Filled 2015-04-07: qty 1

## 2015-04-07 MED ORDER — FENTANYL CITRATE (PF) 100 MCG/2ML IJ SOLN
INTRAMUSCULAR | Status: DC | PRN
  Administered 2015-04-07 (×2): 50 ug via INTRAVENOUS

## 2015-04-07 MED ORDER — LIDOCAINE HCL (CARDIAC) 20 MG/ML IV SOLN
INTRAVENOUS | Status: AC
  Filled 2015-04-07: qty 5

## 2015-04-07 MED ORDER — HYDROCODONE-ACETAMINOPHEN 7.5-325 MG PO TABS
1.0000 | ORAL_TABLET | Freq: Once | ORAL | Status: AC | PRN
  Administered 2015-04-07: 1 via ORAL

## 2015-04-07 MED ORDER — PROPOFOL 10 MG/ML IV BOLUS
INTRAVENOUS | Status: AC
  Filled 2015-04-07: qty 20

## 2015-04-07 MED ORDER — BUPIVACAINE HCL (PF) 0.25 % IJ SOLN
INTRAMUSCULAR | Status: AC
  Filled 2015-04-07: qty 30

## 2015-04-07 MED ORDER — MIDAZOLAM HCL 2 MG/2ML IJ SOLN
INTRAMUSCULAR | Status: DC | PRN
  Administered 2015-04-07: 2 mg via INTRAVENOUS

## 2015-04-07 MED ORDER — DEXAMETHASONE SODIUM PHOSPHATE 10 MG/ML IJ SOLN
INTRAMUSCULAR | Status: DC | PRN
  Administered 2015-04-07: 10 mg via INTRAVENOUS

## 2015-04-07 MED ORDER — GLYCOPYRROLATE 0.2 MG/ML IJ SOLN
INTRAMUSCULAR | Status: DC | PRN
  Administered 2015-04-07 (×2): 0.1 mg via INTRAVENOUS

## 2015-04-07 MED ORDER — MIDAZOLAM HCL 2 MG/2ML IJ SOLN
INTRAMUSCULAR | Status: AC
  Filled 2015-04-07: qty 2

## 2015-04-07 MED ORDER — SCOPOLAMINE 1 MG/3DAYS TD PT72
MEDICATED_PATCH | TRANSDERMAL | Status: AC
  Administered 2015-04-07: 1.5 mg via TRANSDERMAL
  Filled 2015-04-07: qty 1

## 2015-04-07 MED ORDER — SODIUM CHLORIDE 0.9 % IJ SOLN
INTRAMUSCULAR | Status: AC
  Filled 2015-04-07: qty 50

## 2015-04-07 MED ORDER — PROPOFOL 10 MG/ML IV BOLUS
INTRAVENOUS | Status: DC | PRN
  Administered 2015-04-07: 200 mg via INTRAVENOUS

## 2015-04-07 MED ORDER — FENTANYL CITRATE (PF) 100 MCG/2ML IJ SOLN
INTRAMUSCULAR | Status: AC
  Administered 2015-04-07: 50 ug via INTRAVENOUS
  Filled 2015-04-07: qty 2

## 2015-04-07 MED ORDER — LIDOCAINE HCL (CARDIAC) 20 MG/ML IV SOLN
INTRAVENOUS | Status: DC | PRN
  Administered 2015-04-07: 80 mg via INTRAVENOUS

## 2015-04-07 MED ORDER — CEFAZOLIN SODIUM-DEXTROSE 2-3 GM-% IV SOLR
INTRAVENOUS | Status: AC
  Filled 2015-04-07: qty 50

## 2015-04-07 MED ORDER — KETOROLAC TROMETHAMINE 30 MG/ML IJ SOLN
INTRAMUSCULAR | Status: AC
  Filled 2015-04-07: qty 1

## 2015-04-07 MED ORDER — FENTANYL CITRATE (PF) 250 MCG/5ML IJ SOLN
INTRAMUSCULAR | Status: AC
  Filled 2015-04-07: qty 5

## 2015-04-07 SURGICAL SUPPLY — 16 items
ABLATOR ENDOMETRIAL BIPOLAR (ABLATOR) ×2 IMPLANT
CATH ROBINSON RED A/P 16FR (CATHETERS) ×2 IMPLANT
CLOTH BEACON ORANGE TIMEOUT ST (SAFETY) ×2 IMPLANT
CONTAINER PREFILL 10% NBF 60ML (FORM) ×4 IMPLANT
GLOVE BIO SURGEON STRL SZ7.5 (GLOVE) ×2 IMPLANT
GLOVE BIOGEL PI IND STRL 7.0 (GLOVE) ×1 IMPLANT
GLOVE BIOGEL PI INDICATOR 7.0 (GLOVE) ×1
GOWN STRL REUS W/TWL LRG LVL3 (GOWN DISPOSABLE) ×4 IMPLANT
PACK VAGINAL MINOR WOMEN LF (CUSTOM PROCEDURE TRAY) ×2 IMPLANT
PAD OB MATERNITY 4.3X12.25 (PERSONAL CARE ITEMS) ×2 IMPLANT
PAD PREP 24X48 CUFFED NSTRL (MISCELLANEOUS) ×2 IMPLANT
SYR TB 1ML 25GX5/8 (SYRINGE) ×2 IMPLANT
TOWEL OR 17X24 6PK STRL BLUE (TOWEL DISPOSABLE) ×4 IMPLANT
TUBING AQUILEX INFLOW (TUBING) ×2 IMPLANT
TUBING AQUILEX OUTFLOW (TUBING) ×2 IMPLANT
WATER STERILE IRR 1000ML POUR (IV SOLUTION) ×2 IMPLANT

## 2015-04-07 NOTE — Op Note (Signed)
04/07/2015  1:31 PM  PATIENT:  Maudie MercuryKori L Mcclintic  40 y.o. female  PRE-OPERATIVE DIAGNOSIS:  Menorrhagia  POST-OPERATIVE DIAGNOSIS:  Menorrhagia Endometrial polyp  PROCEDURE:  Procedure(s): DILATATION & CURETTAGE HYSTEROSCOPY WITH NOVASURE ABLATION ENDOMETRIAL POLYPECTOMY  SURGEON:  Surgeon(s): Olivia Mackieichard Teighlor Korson, MD  ASSISTANTS: none   ANESTHESIA:   local and general  ESTIMATED BLOOD LOSS: * No blood loss amount entered *   DRAINS: none   LOCAL MEDICATIONS USED:  MARCAINE    and Amount: 20 ml  SPECIMEN:  Source of Specimen:  EMC WITH POLYP  DISPOSITION OF SPECIMEN:  PATHOLOGY  COUNTS:  YES  DICTATION #: [id number]  PLAN OF CARE: DC HOME  PATIENT DISPOSITION:  PACU - hemodynamically stable.

## 2015-04-07 NOTE — Transfer of Care (Signed)
Immediate Anesthesia Transfer of Care Note  Patient: [name]  Procedure(s) Performed: Procedure(s): DILATATION & CURETTAGE/HYSTEROSCOPY WITH NOVASURE ABLATION (N/A)  Patient Location: PACU  Anesthesia Type:General  Level of Consciousness: awake, alert , oriented and patient cooperative  Airway & Oxygen Therapy: Patient Spontanous Breathing and Patient connected to nasal cannula oxygen  Post-op Assessment: Report given to RN and Post -op Vital signs reviewed and stable  Post vital signs: Reviewed and stable  Last Vitals:  Filed Vitals:   04/07/15 1134  BP: 133/75  Pulse: 65  Temp: 36.6 C  Resp: 18    Complications: No apparent anesthesia complications

## 2015-04-07 NOTE — Progress Notes (Signed)
Patient ID: Tracy MercuryKori L Skillman, female   DOB: 07/11/1975, 40 y.o.   MRN: [id number] Patient seen and examined. Consent witnessed and signed. No changes noted. Update completed.

## 2015-04-07 NOTE — Anesthesia Postprocedure Evaluation (Signed)
Anesthesia Post Note  Patient: [name]  Procedure(s) Performed: Procedure(s) (LRB): DILATATION & CURETTAGE/HYSTEROSCOPY WITH NOVASURE ABLATION (N/A)  Patient location during evaluation: PACU Anesthesia Type: General Level of consciousness: awake and alert and oriented Pain management: pain level controlled Vital Signs Assessment: post-procedure vital signs reviewed and stable Respiratory status: spontaneous breathing, nonlabored ventilation and respiratory function stable Cardiovascular status: blood pressure returned to baseline and stable Postop Assessment: no signs of nausea or vomiting Anesthetic complications: no    Last Vitals:  Filed Vitals:   04/07/15 1445 04/07/15 1500  BP: 106/63 111/73  Pulse: 72 63  Temp:    Resp: 20 14    Last Pain:  Filed Vitals:   04/07/15 1508  PainSc: 2                  Britney Newstrom A.

## 2015-04-07 NOTE — Anesthesia Preprocedure Evaluation (Signed)
Anesthesia Evaluation  Patient identified by MRN, date of birth, ID band Patient awake    Reviewed: Allergy & Precautions, NPO status , Patient's Chart, lab work & pertinent test results  History of Anesthesia Complications (+) PONV and history of anesthetic complications  Airway Mallampati: II  TM Distance: >3 FB Neck ROM: Full    Dental no notable dental hx. (+) Teeth Intact   Pulmonary asthma ,    Pulmonary exam normal breath sounds clear to auscultation       Cardiovascular hypertension, Pt. on medications Normal cardiovascular exam Rhythm:Regular Rate:Normal     Neuro/Psych  Headaches, negative psych ROS   GI/Hepatic negative GI ROS, Neg liver ROS,   Endo/Other  Hypothyroidism Hyperthyroidism Hx/o Graves disease  Renal/GU negative Renal ROS  negative genitourinary   Musculoskeletal  (+) Arthritis ,   Abdominal (+) + obese,   Peds  Hematology negative hematology ROS (+)   Anesthesia Other Findings   Reproductive/Obstetrics negative OB ROS                             Anesthesia Physical Anesthesia Plan  ASA: II  Anesthesia Plan: General   Post-op Pain Management:    Induction: Intravenous  Airway Management Planned: LMA  Additional Equipment:   Intra-op Plan:   Post-operative Plan: Extubation in OR  Informed Consent: I have reviewed the patients History and Physical, chart, labs and discussed the procedure including the risks, benefits and alternatives for the proposed anesthesia with the patient or authorized representative who has indicated his/her understanding and acceptance.   Dental advisory given  Plan Discussed with: CRNA, Anesthesiologist and Surgeon  Anesthesia Plan Comments:         Anesthesia Quick Evaluation

## 2015-04-07 NOTE — Anesthesia Procedure Notes (Signed)
Procedure Name: LMA Insertion Date/Time: 04/07/2015 1:12 PM Performed by: Earmon PhoenixWILKERSON, Airrion Otting P Pre-anesthesia Checklist: Patient identified, Timeout performed, Emergency Drugs available, Suction available and Patient being monitored Patient Re-evaluated:Patient Re-evaluated prior to inductionOxygen Delivery Method: Circle system utilized Preoxygenation: Pre-oxygenation with 100% oxygen Intubation Type: IV induction Ventilation: Mask ventilation without difficulty LMA: LMA inserted LMA Size: 4.0 Tube type: Oral Number of attempts: 1 Dental Injury: Teeth and Oropharynx as per pre-operative assessment

## 2015-04-07 NOTE — Discharge Instructions (Signed)

## 2015-04-08 NOTE — Op Note (Signed)
NAMPatrici Ranks:  Higgins, [name]                  ACCOUNT NO.:  [id number]  MEDICAL RECORD NO.:  [id number]  LOCATION:  [facility]                          [facility]ACILITY:  [facility]  PHYSICIAN:  Lenoard Adenichard J. Namita Yearwood, M.D.DATE OF BIRTH:  [birth year]  DATE OF PROCEDURE: DATE OF DISCHARGE:  04/07/2015                              OPERATIVE REPORT   PREOPERATIVE DIAGNOSIS:  Refractory menorrhagia.  POSTOPERATIVE DIAGNOSIS:  Refractory menorrhagia plus endometrial polyp.  PROCEDURE:  Diagnostic hysteroscopy, D and C, endometrial polypectomy.  SURGEON:  Lenoard Adenichard J. Layden Caterino, MD.  ASSISTANT:  None.  ANESTHESIA:  General and local.  ESTIMATED BLOOD LOSS:  Less than 50 mL.  FLUID DEFICIT:  Less than 100 mL.  COMPLICATIONS:  None.  DRAINS:  None.  COUNTS:  Correct.  SPECIMEN:  Endometrial curettings and polyp to pathology.  DISPOSITION:  The patient recovery in good condition.  BRIEF OPERATIVE NOTE:  After being apprised of risks of the anesthesia, infection, bleeding, uterine perforation, possible need for repair, delayed versus immediate complications to include bowel and bladder injury, possible need for repair, the patient was brought to the operating room, where she was administered a general anesthetic without complications, prepped and draped in usual sterile fashion. Catheterized to the bladder.  Bladder was empty.  Exam under anesthesia revealed a slightly enlarged anteflexed uterus and no adnexal masses. Dilute Marcaine solution placed, paracervical block 20 mL, total cervix easily dilated up to a #23 Pratt dilator.  Hysteroscope placed. Visualization revealed a left lateral wall endometrial polyp, which was removed using polyp forceps.  Sharp curettage used to sample the endometrium in a 4-quadrant method.  The NovaSure device was then placed, seated to a length of 6.5, a width of 4.5, initiated with a negative CO2 test.  Procedure was then initiated per standard protocol without difficulty.  The  device was then removed at the end of the procedure and inspected found to be intact.  The uterine cavity was reinspected hysteroscopically and found to be intact with evidence of excellent global endometrial ablation.  The patient tolerated the procedure well, was awakened, and transferred to recovery room in good condition.     Lenoard Adenichard J. Calynn Ferrero, M.D.     RJT/MEDQ  D:  04/07/2015  T:  04/08/2015  Job:  [id number]

## 2015-04-10 ENCOUNTER — Encounter (HOSPITAL_COMMUNITY): Payer: Self-pay | Admitting: Obstetrics and Gynecology

## 2015-05-22 ENCOUNTER — Other Ambulatory Visit (INDEPENDENT_AMBULATORY_CARE_PROVIDER_SITE_OTHER): Payer: 59

## 2015-05-22 DIAGNOSIS — E032 Hypothyroidism due to medicaments and other exogenous substances: Secondary | ICD-10-CM

## 2015-05-23 LAB — T3, FREE: T3 FREE: 2.8 pg/mL (ref 2.3–4.2)

## 2015-05-23 LAB — T4, FREE: FREE T4: 0.88 ng/dL (ref 0.60–1.60)

## 2015-05-23 LAB — TSH: TSH: 0.77 u[IU]/mL (ref 0.35–4.50)

## 2015-05-25 ENCOUNTER — Encounter: Payer: Self-pay | Admitting: Internal Medicine

## 2015-06-01 ENCOUNTER — Encounter: Payer: Self-pay | Admitting: Internal Medicine

## 2015-06-06 ENCOUNTER — Telehealth: Payer: Self-pay | Admitting: Internal Medicine

## 2015-06-06 DIAGNOSIS — Z7689 Persons encountering health services in other specified circumstances: Secondary | ICD-10-CM

## 2015-06-06 NOTE — Telephone Encounter (Signed)
Form completed,  The charge is $50 becaues it required accessing her records from [facility] ,  Needs med list printed out  as well.  Red folder

## 2015-06-06 NOTE — Telephone Encounter (Signed)
Formed faxed as requested to [employer] copy for chart and billing. Sent patient my chart message placed original at front desk.

## 2015-07-07 ENCOUNTER — Telehealth: Payer: Self-pay | Admitting: Internal Medicine

## 2015-07-07 NOTE — Telephone Encounter (Signed)
A Form was filled out by the physician to permit the patient to Donate Blood. Patient is upset that she was charged a 50.00 fee to check a box and a signature. The patient would like a call back after the Dr Darden Amberesponse to why she was charged an extreme amount.

## 2015-07-07 NOTE — Telephone Encounter (Signed)
Tracy MessierKathy are you aware of what she is referring to?

## 2015-07-10 NOTE — Telephone Encounter (Signed)
I do not charge a fee to simple forms , unless I have to look up information in the chart , and this required me looking up and accessing records from [facility]

## 2015-07-10 NOTE — Telephone Encounter (Signed)
I sent patient MY Chart message stating thet if form required researching the medical records the standard charge is 50?

## 2015-09-12 ENCOUNTER — Other Ambulatory Visit: Payer: Self-pay | Admitting: Internal Medicine

## 2015-10-30 LAB — HM MAMMOGRAPHY

## 2015-11-03 ENCOUNTER — Encounter: Payer: Self-pay | Admitting: *Deleted

## 2016-01-07 ENCOUNTER — Other Ambulatory Visit: Payer: Self-pay | Admitting: Internal Medicine

## 2016-01-09 NOTE — Telephone Encounter (Signed)
Last OV 5/16 please advise to refill.

## 2016-05-02 ENCOUNTER — Ambulatory Visit (INDEPENDENT_AMBULATORY_CARE_PROVIDER_SITE_OTHER): Payer: 59 | Admitting: Family

## 2016-05-02 ENCOUNTER — Encounter: Payer: Self-pay | Admitting: Family

## 2016-05-02 VITALS — BP 130/80 | HR 73 | Temp 98.2°F | Ht 65.0 in | Wt 195.2 lb

## 2016-05-02 DIAGNOSIS — E032 Hypothyroidism due to medicaments and other exogenous substances: Secondary | ICD-10-CM

## 2016-05-02 DIAGNOSIS — I10 Essential (primary) hypertension: Secondary | ICD-10-CM | POA: Diagnosis not present

## 2016-05-02 DIAGNOSIS — M549 Dorsalgia, unspecified: Secondary | ICD-10-CM

## 2016-05-02 DIAGNOSIS — M545 Low back pain: Secondary | ICD-10-CM | POA: Diagnosis not present

## 2016-05-02 DIAGNOSIS — G8929 Other chronic pain: Secondary | ICD-10-CM | POA: Diagnosis not present

## 2016-05-02 LAB — COMPREHENSIVE METABOLIC PANEL
ALBUMIN: 4.6 g/dL (ref 3.5–5.2)
ALK PHOS: 39 U/L (ref 39–117)
ALT: 7 U/L (ref 0–35)
AST: 9 U/L (ref 0–37)
BILIRUBIN TOTAL: 1 mg/dL (ref 0.2–1.2)
BUN: 12 mg/dL (ref 6–23)
CALCIUM: 9.7 mg/dL (ref 8.4–10.5)
CO2: 33 mEq/L — ABNORMAL HIGH (ref 19–32)
Chloride: 100 mEq/L (ref 96–112)
Creatinine, Ser: 0.67 mg/dL (ref 0.40–1.20)
GFR: 103.32 mL/min (ref >60.00)
Glucose, Bld: 81 mg/dL (ref 70–99)
POTASSIUM: 3.7 meq/L (ref 3.5–5.1)
Sodium: 140 mEq/L (ref 135–145)
TOTAL PROTEIN: 6.9 g/dL (ref 6.0–8.3)

## 2016-05-02 LAB — TSH: TSH: <0.01 u[IU]/mL — ABNORMAL LOW (ref 0.35–4.50)

## 2016-05-02 LAB — T4, FREE: Free T4: 1.72 ng/dL — ABNORMAL HIGH (ref 0.60–1.60)

## 2016-05-02 LAB — T3, FREE: T3, Free: 4.1 pg/mL (ref 2.3–4.2)

## 2016-05-02 MED ORDER — METHOCARBAMOL 750 MG PO TABS: 750.0000 mg | ORAL_TABLET | Freq: Four times a day (QID) | ORAL | 1 refills | Status: DC | PRN

## 2016-05-02 MED ORDER — LEVOTHYROXINE SODIUM 300 MCG PO TABS: 300.0000 ug | ORAL_TABLET | Freq: Every day | ORAL | 0 refills | Status: DC

## 2016-05-02 MED ORDER — TRIAMTERENE-HCTZ 37.5-25 MG PO TABS: 1.0000 | ORAL_TABLET | Freq: Every day | ORAL | 1 refills | Status: DC

## 2016-05-02 NOTE — Addendum Note (Signed)
Addended by: Allegra Grana on: 05/02/2016 08:08 AM   Modules accepted: Orders

## 2016-05-02 NOTE — Progress Notes (Signed)
Pre visit review using our clinic review tool, if applicable. No additional management support is needed unless otherwise documented below in the visit note. 

## 2016-05-02 NOTE — Assessment & Plan Note (Signed)
Stable. Will continue current regimen. Pending cmp.

## 2016-05-02 NOTE — Patient Instructions (Signed)
Refills  Labs  As discussed, please share  Your thyroid labs with endocrine so they may further adjust if needed  Pleasure meeting you

## 2016-05-02 NOTE — Assessment & Plan Note (Addendum)
s/p ablation. Continues to follow with endocrine and will share labs with patient so she can share with endocrine who will adjust. Pending labs.

## 2016-05-02 NOTE — Assessment & Plan Note (Signed)
Chronic. Stable on prn robaxin. Refilled.

## 2016-05-02 NOTE — Progress Notes (Signed)
Subjective:    Patient ID: [name], female    DOB: Feb 14, 1975, 41 y.o.   MRN: [id number]  CC: [name] is a 41 y.o. female who presents today for follow up.   HPI: Here today for medication refills.   HTN- compliant with HCTZ. Also started hctz for le swelling, None today.  She keeps potassium supplement on hand as h/o palpiations , SVT ( evaluated by cardiology) when potassium would get low.  No current palpitations.  Denies exertional chest pain or pressure, numbness or tingling radiating to left arm or jaw, palpitations, dizziness, frequent headaches, changes in vision, or shortness of breath.   Neck and low back pain-  Has been on robaxin for years for years. Works as [employer] fundraiser. Continues to use robaxin prn 2x per week for painful 'muscle spasms'. Follows with [facility]. Uses tens unit for pain control. Not drowsy on robaxin. No numbness, tingling in legs.    Hypothyroidism and then had Graves s/p radiation, now with complete hypothyroidism-Follows with endocrine annually however would like TSH checked today. Stable on current dose.       HISTORY:  Past Medical History:  Diagnosis Date  . Allergy   . Arthritis    DDD, bulging disk, spinal stenosis  . Asthma    childhood  . Bradycardia    due to low thyroid  . Chicken pox   . Colitis 2004  . Colon polyps   . Dysrhythmia    SVT  . Graves disease   . Headache    Migraines  . Heart murmur   . Hypertension    extremely high after pregnancy  . Hypothyroidism   . Irritable bowel syndrome (IBS) 2004  . MVP (mitral valve prolapse)   . Palpitations 2000  . PONV (postoperative nausea and vomiting)   . Swelling of both hands   . Swelling of both lower extremities   . Thyroid disease    Past Surgical History:  Procedure Laterality Date  . CARPAL TUNNEL RELEASE Left 2012   Two cyst removal  . CESAREAN SECTION  2006,2010  . CHOLECYSTECTOMY  2001  . COLONOSCOPY    . DILITATION & CURRETTAGE/HYSTROSCOPY WITH  NOVASURE ABLATION N/A 04/07/2015   Procedure: DILATATION & CURETTAGE/HYSTEROSCOPY WITH NOVASURE ABLATION;  Surgeon: Olivia Mackie, MD;  Location: [facility];  Service: Gynecology;  Laterality: N/A;  . MYRINGOTOMY    . radioactive iodine  2014  . TONSILECTOMY/ADENOIDECTOMY WITH MYRINGOTOMY    . TRANSTHORACIC ECHOCARDIOGRAM    . TUBAL LIGATION  2010   Family History  Problem Relation Age of Onset  . Alcohol abuse Mother   . Arthritis Mother   . Hyperlipidemia Mother   . Hypertension Mother   . Arthritis Father   . Stroke Maternal Grandfather   . Heart disease Maternal Grandfather   . Heart disease Paternal Grandfather     Allergies: Patient has no known allergies. Current Outpatient Prescriptions on File Prior to Visit  Medication Sig Dispense Refill  . Calcium Carb-Cholecalciferol (CALTRATE 600+D) 600-800 MG-UNIT TABS Take 1 tablet by mouth daily.    . fexofenadine (ALLEGRA) 180 MG tablet Take 180 mg by mouth daily.    Marland Kitchen HYDROcodone-ibuprofen (VICOPROFEN) 7.5-200 MG tablet Take 1 tablet by mouth every 8 (eight) hours as needed for moderate pain. 20 tablet 0  . ibuprofen (ADVIL,MOTRIN) 200 MG tablet Take 600 mg by mouth every 6 (six) hours as needed for headache or mild pain.    Marland Kitchen levothyroxine (SYNTHROID, LEVOTHROID) 300  MCG tablet Take 300 mcg by mouth daily before breakfast.    . methocarbamol (ROBAXIN) 750 MG tablet Take 750 mg by mouth every 6 (six) hours as needed for muscle spasms.    . potassium chloride SA (K-DUR,KLOR-CON) 20 MEQ tablet Take 1 tablet (20 mEq total) by mouth daily. 30 tablet 1  . triamterene-hydrochlorothiazide (MAXZIDE-25) 37.5-25 MG per tablet Take 1 tablet by mouth  daily 90 tablet 4   No current facility-administered medications on file prior to visit.     Social History  Substance Use Topics  . Smoking status: Never Smoker  . Smokeless tobacco: Never Used  . Alcohol use Yes    Review of Systems  Constitutional: Negative for chills and fever.  Eyes:  Negative for visual disturbance.  Respiratory: Negative for cough.   Cardiovascular: Positive for leg swelling. Negative for chest pain and palpitations.  Gastrointestinal: Negative for nausea and vomiting.  Neurological: Negative for speech difficulty and headaches.      Objective:    BP 130/80   Pulse 73   Temp 98.2 F (36.8 C) (Oral)   Ht  (1.651 m)   Wt 195 lb 3.2 oz (88.5 kg)   SpO2 98%   BMI 32.48 kg/m  BP Readings from Last 3 Encounters:  05/02/16 130/80  04/07/15 108/67  03/31/15 120/81   Wt Readings from Last 3 Encounters:  05/02/16 195 lb 3.2 oz (88.5 kg)  03/31/15 179 lb 2 oz (81.3 kg)  05/24/14 150 lb (68 kg)    Physical Exam  Constitutional: She appears well-developed and well-nourished.  Eyes: Conjunctivae are normal.  Neck: No thyroid mass and no thyromegaly present.  Cardiovascular: Normal rate, regular rhythm, normal heart sounds and normal pulses.   No LE edema.   Pulmonary/Chest: Effort normal and breath sounds normal. She has no wheezes. She has no rhonchi. She has no rales.  Lymphadenopathy:       Head (right side): No submental, no submandibular, no tonsillar, no preauricular, no posterior auricular and no occipital adenopathy present.       Head (left side): No submental, no submandibular, no tonsillar, no preauricular, no posterior auricular and no occipital adenopathy present.    She has no cervical adenopathy.  Neurological: She is alert.  Skin: Skin is warm and dry.  Psychiatric: She has a normal mood and affect. Her speech is normal and behavior is normal. Thought content normal.  Vitals reviewed.      Assessment & Plan:   Problem List Items Addressed This Visit      Cardiovascular and Mediastinum   Essential hypertension - Primary    Stable. Will continue current regimen. Pending cmp.       Relevant Orders   Comprehensive metabolic panel     Endocrine   Iatrogenic hypothyroidism    s/p ablation. Continues to follow with  endocrine and will share labs with patient so she can share with endocrine who will adjust. Pending labs.       Relevant Orders   T4, free   TSH   T3, free     Other   Chronic back pain    Chronic. Stable on prn robaxin. Refilled.           I am having Ms. Catlin maintain her fexofenadine, triamterene-hydrochlorothiazide, potassium chloride SA, levothyroxine, ibuprofen, Calcium Carb-Cholecalciferol, methocarbamol, and HYDROcodone-ibuprofen.   No orders of the defined types were placed in this encounter.   Return precautions given.   Risks, benefits, and alternatives of  the medications and treatment plan prescribed today were discussed, and patient expressed understanding.   Education regarding symptom management and diagnosis given to patient on AVS.  Continue to follow with TULLO, Mar Daring, MD for routine health maintenance.   [name] and I agreed with plan.   Rennie Plowman, FNP

## 2016-06-19 ENCOUNTER — Ambulatory Visit: Payer: Self-pay | Admitting: Internal Medicine

## 2016-06-27 DIAGNOSIS — E039 Hypothyroidism, unspecified: Secondary | ICD-10-CM | POA: Diagnosis not present

## 2016-06-27 DIAGNOSIS — Z923 Personal history of irradiation: Secondary | ICD-10-CM | POA: Diagnosis not present

## 2016-08-01 ENCOUNTER — Ambulatory Visit (INDEPENDENT_AMBULATORY_CARE_PROVIDER_SITE_OTHER): Payer: 59 | Admitting: Internal Medicine

## 2016-08-01 ENCOUNTER — Encounter: Payer: Self-pay | Admitting: Internal Medicine

## 2016-08-01 DIAGNOSIS — Z6831 Body mass index (BMI) 31.0-31.9, adult: Secondary | ICD-10-CM | POA: Diagnosis not present

## 2016-08-01 DIAGNOSIS — F411 Generalized anxiety disorder: Secondary | ICD-10-CM | POA: Diagnosis not present

## 2016-08-01 DIAGNOSIS — F410 Panic disorder [episodic paroxysmal anxiety] without agoraphobia: Secondary | ICD-10-CM

## 2016-08-01 DIAGNOSIS — E6609 Other obesity due to excess calories: Secondary | ICD-10-CM

## 2016-08-01 MED ORDER — PEN NEEDLES 31G X 6 MM MISC: 1 refills | Status: DC

## 2016-08-01 MED ORDER — LIRAGLUTIDE -WEIGHT MANAGEMENT 18 MG/3ML ~~LOC~~ SOPN: 0.6000 mg | PEN_INJECTOR | Freq: Every day | SUBCUTANEOUS | 0 refills | Status: DC

## 2016-08-01 MED ORDER — SERTRALINE HCL 50 MG PO TABS: 50.0000 mg | ORAL_TABLET | Freq: Every day | ORAL | 3 refills | Status: DC

## 2016-08-01 NOTE — Patient Instructions (Addendum)
Make sure you are eating 5-6 times daily to keep your metabolism up   Limit pasta to once a week  Choose yogurt that is 9 net carbs or less.  ([employer]er, [employer], [place],  [employer] Triple Zero)   Here are several low carb protein bars that make great snacks:   Power Crunch  KIND 5 g sugar  Variety  Quest  Atkins   I am recommending use of the medication called Saxenda to help you lose weight.  It is similar to a a medicine that is used to treat diabetes called Victoza,  So It may lower your blood sugars .   It is injected daily in incrementally increasing doses (if tolerated,  Nausea usually resolves in a few days)"  0.6 mg daily   Week 1 1.2 mg daily Week 2 1.8 mg  Daly Week 3 2.4 mg daily Week 4 3.0 mg daily Week 5 and ongoing     Please start the Zoloft (sertraline) at 1/2 tablet daily in the evening for the first few days to avoid nausea.  You can increase to a full tablet after 4 days if you havenot developed side effects of nausea.  If the sertraline interferes with your sleep, take it in the morning with breakfast  instead  Please  E mail me in  4 weeks ,  Or e mail me to let me know how it is helping your anxiety

## 2016-08-01 NOTE — Progress Notes (Signed)
Subjective:  Patient ID: [name], female    DOB: 03/15/1975  Age: 41 y.o. MRN: [id number]  CC: Diagnoses of Class 1 obesity due to excess calories without serious comorbidity with body mass index (BMI) of 31.0 to 31.9 in adult and Generalized anxiety disorder with panic attacks were pertinent to this visit.  HPI [name] presents for follow up on obesity, hypothyroidism .    She has had a Weight gain of 5 lb since last visit in April, but is  down from 209 in 2015  [facility] of hyothyroidism.  .has been referred to weight specialist, but the cost is $70 out of pocket for specialists. Requesting I manage her obesity.   Uses my fitness pal.  Charts food.  1200 to 1400 calories daily.  Not enough water intake.  . 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  Reviewed her daily schedule from 5 am to 9 pm .   Discussed saxenda   2) Anxeity:  Irritable and anxious .aggravated by having to live with situtations beyond her control .  Gets worse as school year approaches.  Some panicky feelings,  Heart in throat.  "I am OCD"       Outpatient Medications Prior to Visit  Medication Sig Dispense Refill  . fexofenadine (ALLEGRA) 180 MG tablet Take 180 mg by mouth daily.    Marland Kitchen. ibuprofen (ADVIL,MOTRIN) 200 MG tablet Take 600 mg by mouth every 6 (six) hours as needed for headache or mild pain.    Marland Kitchen. levothyroxine (SYNTHROID, LEVOTHROID) 300 MCG tablet Take 1 tablet (300 mcg total) by mouth daily before breakfast. 90 tablet 0  . methocarbamol (ROBAXIN) 750 MG tablet Take 1 tablet (750 mg total) by mouth every 6 (six) hours as needed for muscle spasms. 30 tablet 1  . potassium chloride SA (K-DUR,KLOR-CON) 20 MEQ tablet Take 1 tablet (20 mEq total) by mouth daily. 30 tablet 1  . triamterene-hydrochlorothiazide (MAXZIDE-25) 37.5-25 MG tablet Take 1 tablet by mouth daily. 90 tablet 1    . Calcium Carb-Cholecalciferol (CALTRATE 600+D) 600-800 MG-UNIT TABS Take 1 tablet by mouth daily.    Marland Kitchen. HYDROcodone-ibuprofen (VICOPROFEN) 7.5-200 MG tablet Take 1 tablet by mouth every 8 (eight) hours as needed for moderate pain. (Patient not taking: Reported on 08/01/2016) 20 tablet 0   No facility-administered medications prior to visit.     Review of Systems;  Patient denies headache, fevers, malaise, unintentional weight loss, skin rash, eye pain, sinus congestion and sinus pain, sore throat, dysphagia,  hemoptysis , cough, dyspnea, wheezing, chest pain, palpitations, orthopnea, edema, abdominal pain, nausea, melena, diarrhea, constipation, flank pain, dysuria, hematuria, urinary  Frequency, nocturia, numbness, tingling, seizures,  Focal weakness, Loss of consciousness,  Tremor, insomnia, depression, anxiety, and suicidal ideation.      Objective:  BP 114/76 (BP Location: Left Arm, Patient Position: Sitting, Cuff Size: Normal)   Pulse 72   Temp 98.4 F (36.9 C) (Oral)   Resp 14   Ht 5\' 5"  (1.651 m)   Wt 200 lb (90.7 kg)   SpO2 98%   BMI 33.28 kg/m   BP Readings from Last 3 Encounters:  08/01/16 114/76  05/02/16 130/80  04/07/15 108/67    Wt Readings from Last 3 Encounters:  08/01/16 200 lb (90.7 kg)  05/02/16 195 lb 3.2 oz (88.5 kg)  03/31/15 179 lb 2 oz (81.3 kg)  General appearance: alert, cooperative and appears stated age Ears: normal TM's and external ear canals both ears Throat: lips, mucosa, and tongue normal; teeth and gums normal Neck: no adenopathy, no carotid bruit, supple, symmetrical, trachea midline and thyroid not enlarged, symmetric, no tenderness/mass/nodules Back: symmetric, no curvature. ROM normal. No CVA tenderness. Lungs: clear to auscultation bilaterally Heart: regular rate and rhythm, S1, S2 normal, no murmur, click, rub or gallop Abdomen: soft, non-tender; bowel sounds normal; no masses,  no organomegaly Pulses: 2+ and symmetric Skin: Skin  color, texture, turgor normal. No rashes or lesions Lymph nodes: Cervical, supraclavicular, and axillary nodes normal.  No results found for: HGBA1C  Lab Results  Component Value Date   CREATININE 0.67 05/02/2016   CREATININE 0.55 03/31/2015   CREATININE 0.58 01/30/2015    Lab Results  Component Value Date   WBC 7.4 03/31/2015   HGB 14.1 03/31/2015   HCT 37.6 03/31/2015   PLT 197 03/31/2015   GLUCOSE 81 05/02/2016   CHOL 136 04/30/2013   TRIG 69 04/30/2013   HDL 48 04/30/2013   LDLCALC 74 04/30/2013   ALT 7 05/02/2016   AST 9 05/02/2016   NA 140 05/02/2016   K 3.7 05/02/2016   CL 100 05/02/2016   CREATININE 0.67 05/02/2016   BUN 12 05/02/2016   CO2 33 (H) 05/02/2016   TSH <0.01 Repeated and verified X2. (L) 05/02/2016   MICROALBUR 0.2 10/02/2012    No results found.  Assessment & Plan:   Problem List Items Addressed This Visit    Generalized anxiety disorder with panic attacks    With selfl reported "OCD" tendencies.  Irritability,  Brought on by feeling a lack of control over situations.   Discussed trial of zoloft.       Relevant Medications   sertraline (ZOLOFT) 50 MG tablet   Obesity    Diet and lack of exercise discussed. Recommended trial of Saxenda for appetite suppression, because without time for exercise I recommended caloric /carb reduction in order to lose weight.       Relevant Medications   Liraglutide -Weight Management (SAXENDA) 18 MG/3ML SOPN      I have discontinued Ms. Egelston's Calcium Carb-Cholecalciferol and HYDROcodone-ibuprofen. I am also having her start on Pen Needles, Liraglutide -Weight Management, and sertraline. Additionally, I am having her maintain her fexofenadine, potassium chloride SA, ibuprofen, levothyroxine, methocarbamol, and triamterene-hydrochlorothiazide.  Meds ordered this encounter  Medications  . Insulin Pen Needle (PEN NEEDLES) 31G X 6 MM MISC    Sig: For use with victoza /saxenda    Dispense:  100 each     Refill:  1  . Liraglutide -Weight Management (SAXENDA) 18 MG/3ML SOPN    Sig: Inject 0.6 mg into the skin daily. Increase dose weekly as follows: Week 2: 1.2 mg daily ; Week 3: 1.8 mg daily; Week 4: 2.4 mg daily    Dispense:  9 mL    Refill:  0  . sertraline (ZOLOFT) 50 MG tablet    Sig: Take 1 tablet (50 mg total) by mouth daily.    Dispense:  30 tablet    Refill:  3   A total of 40 minutes was spent with patient more than half of which was spent in counseling patient on the above mentioned issues , reviewing and explaining recent labs and imaging studies done, and coordination of care.  Medications Discontinued During This Encounter  Medication Reason  . Calcium Carb-Cholecalciferol (CALTRATE 600+D) 600-800 MG-UNIT TABS Patient has not taken in  last 30 days  . HYDROcodone-ibuprofen (VICOPROFEN) 7.5-200 MG tablet Patient has not taken in last 30 days    Follow-up: Return in about 3 months (around 11/01/2016) for 1 month labs if starting saxenda.   Sherlene Shams, MD

## 2016-08-03 DIAGNOSIS — F410 Panic disorder [episodic paroxysmal anxiety] without agoraphobia: Secondary | ICD-10-CM | POA: Insufficient documentation

## 2016-08-03 DIAGNOSIS — F411 Generalized anxiety disorder: Secondary | ICD-10-CM

## 2016-08-03 NOTE — Assessment & Plan Note (Signed)
With selfl reported "OCD" tendencies.  Irritability,  Brought on by feeling a lack of control over situations.   Discussed trial of zoloft.

## 2016-08-03 NOTE — Assessment & Plan Note (Signed)
Diet and lack of exercise discussed. Recommended trial of Saxenda for appetite suppression, because without time for exercise I recommended caloric /carb reduction in order to lose weight.

## 2016-08-07 ENCOUNTER — Telehealth: Payer: Self-pay | Admitting: Internal Medicine

## 2016-08-07 NOTE — Telephone Encounter (Signed)
PA for saxenda started on Cover My Meds

## 2016-08-13 NOTE — Telephone Encounter (Signed)
PA was denied. Appealed through covermymeds on 08/13/2016.

## 2016-09-04 ENCOUNTER — Telehealth: Payer: Self-pay | Admitting: Internal Medicine

## 2016-09-04 ENCOUNTER — Encounter: Payer: Self-pay | Admitting: Internal Medicine

## 2016-09-04 NOTE — Telephone Encounter (Signed)
Can you try to get PA for Saxenda?  If you havent' already.Marland KitchenMarland Kitchen

## 2016-09-04 NOTE — Telephone Encounter (Signed)
A PA was done and denied prior and an appeal is outstanding, not resulted yet. thanks

## 2016-09-17 ENCOUNTER — Encounter: Payer: Self-pay | Admitting: Internal Medicine

## 2016-09-18 ENCOUNTER — Other Ambulatory Visit: Payer: Self-pay | Admitting: Internal Medicine

## 2016-09-18 MED ORDER — SERTRALINE HCL 50 MG PO TABS: 75.0000 mg | ORAL_TABLET | Freq: Every day | ORAL | 2 refills | Status: DC

## 2016-09-18 MED ORDER — SERTRALINE HCL 50 MG PO TABS: 75.0000 mg | ORAL_TABLET | Freq: Every day | ORAL | 3 refills | Status: DC

## 2016-09-22 ENCOUNTER — Other Ambulatory Visit: Payer: Self-pay | Admitting: Family

## 2016-09-22 DIAGNOSIS — I10 Essential (primary) hypertension: Secondary | ICD-10-CM

## 2016-10-02 NOTE — Telephone Encounter (Signed)
Error

## 2016-10-31 DIAGNOSIS — Z1231 Encounter for screening mammogram for malignant neoplasm of breast: Secondary | ICD-10-CM | POA: Diagnosis not present

## 2016-11-05 ENCOUNTER — Telehealth: Payer: Self-pay | Admitting: Internal Medicine

## 2016-11-05 NOTE — Telephone Encounter (Signed)
[facility] has reported that Her mammogram was abnormal on the  Right .  My chart message sent.  .   She will be contacted to get additional films and an ultrasound the facility.

## 2016-11-06 ENCOUNTER — Encounter: Payer: Self-pay | Admitting: Internal Medicine

## 2016-11-11 NOTE — Telephone Encounter (Signed)
Patient aware and has read My chart message.

## 2016-11-17 ENCOUNTER — Emergency Department: Payer: 59

## 2016-11-17 ENCOUNTER — Encounter: Payer: Self-pay | Admitting: *Deleted

## 2016-11-17 ENCOUNTER — Emergency Department
Admission: EM | Admit: 2016-11-17 | Discharge: 2016-11-17 | Disposition: A | Discharge status: Home / Self Care | Payer: 59 | Attending: Emergency Medicine | Admitting: Emergency Medicine

## 2016-11-17 DIAGNOSIS — W19XXXA Unspecified fall, initial encounter: Secondary | ICD-10-CM

## 2016-11-17 DIAGNOSIS — Y999 Unspecified external cause status: Secondary | ICD-10-CM | POA: Diagnosis not present

## 2016-11-17 DIAGNOSIS — J45909 Unspecified asthma, uncomplicated: Secondary | ICD-10-CM | POA: Diagnosis not present

## 2016-11-17 DIAGNOSIS — Y939 Activity, unspecified: Secondary | ICD-10-CM | POA: Diagnosis not present

## 2016-11-17 DIAGNOSIS — W1839XA Other fall on same level, initial encounter: Secondary | ICD-10-CM | POA: Diagnosis not present

## 2016-11-17 DIAGNOSIS — Z79899 Other long term (current) drug therapy: Secondary | ICD-10-CM | POA: Diagnosis not present

## 2016-11-17 DIAGNOSIS — I10 Essential (primary) hypertension: Secondary | ICD-10-CM | POA: Insufficient documentation

## 2016-11-17 DIAGNOSIS — E039 Hypothyroidism, unspecified: Secondary | ICD-10-CM | POA: Diagnosis not present

## 2016-11-17 DIAGNOSIS — Y929 Unspecified place or not applicable: Secondary | ICD-10-CM | POA: Insufficient documentation

## 2016-11-17 DIAGNOSIS — S6991XA Unspecified injury of right wrist, hand and finger(s), initial encounter: Secondary | ICD-10-CM | POA: Diagnosis not present

## 2016-11-17 DIAGNOSIS — S63501A Unspecified sprain of right wrist, initial encounter: Secondary | ICD-10-CM | POA: Diagnosis not present

## 2016-11-17 MED ORDER — METHOCARBAMOL 750 MG PO TABS: 750.0000 mg | ORAL_TABLET | Freq: Four times a day (QID) | ORAL | 0 refills | Status: DC

## 2016-11-17 MED ORDER — OXYCODONE-ACETAMINOPHEN 5-325 MG PO TABS
1.0000 | ORAL_TABLET | Freq: Once | ORAL | Status: AC
  Administered 2016-11-17: 1 via ORAL
  Filled 2016-11-17: qty 1

## 2016-11-17 MED ORDER — IBUPROFEN 600 MG PO TABS: 600.0000 mg | ORAL_TABLET | Freq: Four times a day (QID) | ORAL | 0 refills | Status: AC | PRN

## 2016-11-17 MED ORDER — ORPHENADRINE CITRATE 30 MG/ML IJ SOLN
60.0000 mg | Freq: Two times a day (BID) | INTRAMUSCULAR | Status: DC
  Administered 2016-11-17: 60 mg via INTRAMUSCULAR
  Filled 2016-11-17: qty 2

## 2016-11-17 MED ORDER — KETOROLAC TROMETHAMINE 30 MG/ML IJ SOLN
30.0000 mg | Freq: Once | INTRAMUSCULAR | Status: AC
  Administered 2016-11-17: 30 mg via INTRAMUSCULAR
  Filled 2016-11-17: qty 1

## 2016-11-17 NOTE — ED Provider Notes (Signed)
[facility] Emergency Department Provider Note  ____________________________________________  Time seen: Approximately 3:15 PM  I have reviewed the triage vital signs and the nursing notes.   HISTORY  Chief Complaint Fall    HPI Tracy MercuryKori L Magadan is a 41 y.o. female that presents to the emergency department for evaluation right wrist pain after falling. She tripped on gravel. Pain is primarily at the base of the thumb and into her wrist. Pain is primarily when she moves her thumb. She did not hit her head or loose consciousness. No blood thinners.     Past Medical History:  Diagnosis Date  . Allergy   . Arthritis    DDD, bulging disk, spinal stenosis  . Asthma    childhood  . Bradycardia    due to low thyroid  . Chicken pox   . Colitis 2004  . Colon polyps   . Dysrhythmia    SVT  . Graves disease   . Headache    Migraines  . Heart murmur   . Hypertension    extremely high after pregnancy  . Hypothyroidism   . Irritable bowel syndrome (IBS) 2004  . MVP (mitral valve prolapse)   . Palpitations 2000  . PONV (postoperative nausea and vomiting)   . Swelling of both hands   . Swelling of both lower extremities   . Thyroid disease     Patient Active Problem List   Diagnosis Date Noted  . Generalized anxiety disorder with panic attacks 08/03/2016  . Chronic back pain 05/02/2016  . Obesity 02/14/2014  . Essential hypertension 11/14/2013  . Atypical squamous cells of undetermined significance (ASCUS) on Papanicolaou smear of cervix 02/04/2013  . Iatrogenic hypothyroidism 02/04/2013  . Encounter for preventive health examination 01/31/2013  . Graves disease 11/26/2012  . Abnormal mammogram 10/14/2012  . Colitis, nonspecific 10/03/2012  . Edema 10/03/2012  . Paroxysmal SVT (supraventricular tachycardia) (HCC) 10/03/2012    Past Surgical History:  Procedure Laterality Date  . CARPAL TUNNEL RELEASE Left 2012   Two cyst removal  . CESAREAN  SECTION  2006,2010  . CHOLECYSTECTOMY  2001  . COLONOSCOPY    . MYRINGOTOMY    . radioactive iodine  2014  . TONSILECTOMY/ADENOIDECTOMY WITH MYRINGOTOMY    . TRANSTHORACIC ECHOCARDIOGRAM    . TUBAL LIGATION  2010    Prior to Admission medications   Medication Sig Start Date End Date Taking? Authorizing Provider  fexofenadine (ALLEGRA) 180 MG tablet Take 180 mg by mouth daily.    [provider]  ibuprofen (ADVIL,MOTRIN) 200 MG tablet Take 600 mg by mouth every 6 (six) hours as needed for headache or mild pain.    [provider]  ibuprofen (ADVIL,MOTRIN) 600 MG tablet Take 1 tablet (600 mg total) every 6 (six) hours as needed by mouth. 11/17/16   Enid DerryWagner, Everlina Gotts, PA-C  Insulin Pen Needle (PEN NEEDLES) 31G X 6 MM MISC For use with victoza /saxenda 08/01/16   Sherlene Shamsullo, Teresa L, MD  levothyroxine (SYNTHROID, LEVOTHROID) 300 MCG tablet Take 1 tablet (300 mcg total) by mouth daily before breakfast. 05/02/16   Allegra GranaArnett, Margaret G, FNP  Liraglutide -Weight Management (SAXENDA) 18 MG/3ML SOPN Inject 0.6 mg into the skin daily. Increase dose weekly as follows: Week 2: 1.2 mg daily ; Week 3: 1.8 mg daily; Week 4: 2.4 mg daily 08/01/16   Sherlene Shamsullo, Teresa L, MD  methocarbamol (ROBAXIN) 750 MG tablet Take 1 tablet (750 mg total) by mouth every 6 (six) hours as needed for muscle  spasms. 05/02/16   Allegra GranaArnett, Margaret G, FNP  methocarbamol (ROBAXIN-750) 750 MG tablet Take 1 tablet (750 mg total) 4 (four) times daily by mouth. 11/17/16   Enid DerryWagner, Madigan Rosensteel, PA-C  potassium chloride SA (K-DUR,KLOR-CON) 20 MEQ tablet Take 1 tablet (20 mEq total) by mouth daily. 02/01/15   Sherlene Shamsullo, Teresa L, MD  sertraline (ZOLOFT) 50 MG tablet Take 1.5 tablets (75 mg total) by mouth daily. 09/18/16   Sherlene Shamsullo, Teresa L, MD  triamterene-hydrochlorothiazide (MAXZIDE-25) 37.5-25 MG tablet TAKE 1 TABLET BY MOUTH  DAILY 09/23/16   Sherlene Shamsullo, Teresa L, MD    Allergies Patient has no known allergies.  Family History  Problem Relation Age of  Onset  . Alcohol abuse Mother   . Arthritis Mother   . Hyperlipidemia Mother   . Hypertension Mother   . Arthritis Father   . Stroke Maternal Grandfather   . Heart disease Maternal Grandfather   . Heart disease Paternal Grandfather     Social History Social History   Tobacco Use  . Smoking status: Never Smoker  . Smokeless tobacco: Never Used  Substance Use Topics  . Alcohol use: Yes  . Drug use: No     Review of Systems  Cardiovascular: No chest pain. Respiratory: No SOB. Gastrointestinal: No abdominal pain.  No nausea, no vomiting.  Musculoskeletal: Positive for wrist pain. Skin: Negative for rash, abrasions, lacerations, ecchymosis. Neurological: Negative for headaches, numbness or tingling   ____________________________________________   PHYSICAL EXAM:  VITAL SIGNS: ED Triage Vitals  Enc Vitals Group     BP 11/17/16 1440 115/63     Pulse Rate 11/17/16 1440 65     Resp --      Temp 11/17/16 1440 97.7 F (36.5 C)     Temp Source 11/17/16 1440 Oral     SpO2 11/17/16 1440 98 %     Weight 11/17/16 1441 200 lb (90.7 kg)     Height 11/17/16 1441 5\' 6"  (1.676 m)     Head Circumference --      Peak Flow --      Pain Score 11/17/16 1437 8     Pain Loc --      Pain Edu? --      Excl. in GC? --      Constitutional: Alert and oriented. Well appearing and in no acute distress. Eyes: Conjunctivae are normal. PERRL. EOMI. Head: Atraumatic. ENT:      Ears:      Nose: No congestion/rhinnorhea.      Mouth/Throat: Mucous membranes are moist.  Neck: No stridor.  Cardiovascular: Normal rate, regular rhythm.  Good peripheral circulation.  Symmetric radial pulses. Respiratory: Normal respiratory effort without tachypnea or retractions. Lungs CTAB. Good air entry to the bases with no decreased or absent breath sounds. Gastrointestinal: Bowel sounds 4 quadrants. Soft and nontender to palpation. No guarding or rigidity. No palpable masses. No distention.   Musculoskeletal: Full range of motion to all extremities. No gross deformities appreciated. Tenderness to palpation over radial side of wrist. Pain worse with movement of thumb.  No swelling. Neurologic:  Normal speech and language. No gross focal neurologic deficits are appreciated.  Skin:  Skin is warm, dry and intact. No rash noted.   ____________________________________________   LABS (all labs ordered are listed, but only abnormal results are displayed)  Labs Reviewed - No data to display ____________________________________________  EKG   ____________________________________________  RADIOLOGY Patient presented to the emergency department for evaluation of wrist pain after falling.  Lexine BatonI, Marleena Shubert,  personally viewed and evaluated these images (plain radiographs) as part of my medical decision making, as well as reviewing the written report by the radiologist.  Dg Wrist Complete Right  Result Date: 11/17/2016 CLINICAL DATA:  Larey Seat today and injured wrist. EXAM: RIGHT WRIST - COMPLETE 3+ VIEW COMPARISON:  None. FINDINGS: The joint spaces are maintained.  No acute fracture is identified. IMPRESSION: No acute bony findings. Electronically Signed   By: Rudie Meyer M.D.   On: 11/17/2016 15:55    ____________________________________________    PROCEDURES  Procedure(s) performed:    Procedures    Medications  orphenadrine (NORFLEX) injection 60 mg (60 mg Intramuscular Given 11/17/16 1720)  oxyCODONE-acetaminophen (PERCOCET/ROXICET) 5-325 MG per tablet 1 tablet (1 tablet Oral Given 11/17/16 1536)  ketorolac (TORADOL) 30 MG/ML injection 30 mg (30 mg Intramuscular Given 11/17/16 1721)     ____________________________________________   INITIAL IMPRESSION / ASSESSMENT AND PLAN / ED COURSE  Pertinent labs & imaging results that were available during my care of the patient were reviewed by me and considered in my medical decision making (see chart for  details).  Review of the [place] CSRS was performed in accordance of the [facility] prior to dispensing any controlled drugs.  Patient presented to the emergency department for evaluation of wrist pain after falling.  Vital signs and exam are reassuring.  X-ray negative for acute bony abnormalities.  Splint was given.  Patient has taken Robaxin previously for back pain and would like to try this again.  Patient will be discharged home with prescriptions for Robaxin and ibuprofen. Patient is to follow up with PCP as directed. Patient is given ED precautions to return to the ED for any worsening or new symptoms.     ____________________________________________  FINAL CLINICAL IMPRESSION(S) / ED DIAGNOSES  Final diagnoses:  Fall, initial encounter  Sprain of right wrist, initial encounter      NEW MEDICATIONS STARTED DURING THIS VISIT:  This SmartLink is deprecated. Use AVSMEDLIST instead to display the medication list for a patient.      This chart was dictated using voice recognition software/Dragon. Despite best efforts to proofread, errors can occur which can change the meaning. Any change was purely unintentional.    Enid Derry, PA-C 11/17/16 Rockne Coons, MD 11/18/16 [phone]

## 2016-11-17 NOTE — ED Notes (Signed)
Pt fell in driveway and went to catch self with left hand. Pt has limited movement in wrist and thumb. Pt is A/O and NAD.

## 2016-11-18 ENCOUNTER — Other Ambulatory Visit: Payer: Self-pay | Admitting: Family

## 2016-11-18 DIAGNOSIS — G8929 Other chronic pain: Secondary | ICD-10-CM

## 2016-11-18 DIAGNOSIS — R928 Other abnormal and inconclusive findings on diagnostic imaging of breast: Secondary | ICD-10-CM | POA: Diagnosis not present

## 2016-11-18 DIAGNOSIS — M545 Low back pain: Principal | ICD-10-CM

## 2016-11-18 LAB — HM MAMMOGRAPHY

## 2016-12-02 ENCOUNTER — Encounter: Payer: Self-pay | Admitting: Internal Medicine

## 2016-12-02 DIAGNOSIS — E032 Hypothyroidism due to medicaments and other exogenous substances: Secondary | ICD-10-CM

## 2016-12-03 NOTE — Telephone Encounter (Signed)
Looks like the pt had her thyroid levels checked back in April. Is it for pt to have these labs checked again? Labs have been pended.

## 2016-12-05 ENCOUNTER — Telehealth: Payer: Self-pay | Admitting: Radiology

## 2016-12-05 DIAGNOSIS — E032 Hypothyroidism due to medicaments and other exogenous substances: Secondary | ICD-10-CM

## 2016-12-05 NOTE — Telephone Encounter (Signed)
PT coming in tomorrow for labs, please place future orders. Thank you.  

## 2016-12-06 ENCOUNTER — Other Ambulatory Visit (INDEPENDENT_AMBULATORY_CARE_PROVIDER_SITE_OTHER): Payer: 59

## 2016-12-06 DIAGNOSIS — E032 Hypothyroidism due to medicaments and other exogenous substances: Secondary | ICD-10-CM | POA: Diagnosis not present

## 2016-12-06 LAB — T3, FREE: T3 FREE: 3.2 pg/mL (ref 2.3–4.2)

## 2016-12-06 LAB — TSH: TSH: 1.81 u[IU]/mL (ref 0.35–4.50)

## 2016-12-06 LAB — T4, FREE: FREE T4: 0.96 ng/dL (ref 0.60–1.60)

## 2016-12-06 NOTE — Addendum Note (Signed)
Addended by: Sherlene ShamsULLO, Layan Zalenski L on: 12/06/2016 05:14 PM   Modules accepted: Orders

## 2016-12-06 NOTE — Telephone Encounter (Signed)
The labs have been ordered,  Can you not see them?

## 2016-12-06 NOTE — Telephone Encounter (Signed)
We need to figure out why you can't release labs that I order as "expected."  Labs reordered.

## 2016-12-06 NOTE — Telephone Encounter (Signed)
Can see them in the chart but can't release them since they are not future. Please reorder them future. Thank you.

## 2016-12-08 ENCOUNTER — Encounter: Payer: Self-pay | Admitting: Internal Medicine

## 2017-01-20 ENCOUNTER — Encounter: Payer: Self-pay | Admitting: Internal Medicine

## 2017-01-20 ENCOUNTER — Ambulatory Visit: Payer: 59 | Admitting: Internal Medicine

## 2017-01-20 VITALS — BP 118/84 | HR 70 | Temp 97.5°F | Resp 16 | Ht 65.0 in | Wt 216.2 lb

## 2017-01-20 DIAGNOSIS — L304 Erythema intertrigo: Secondary | ICD-10-CM

## 2017-01-20 DIAGNOSIS — M549 Dorsalgia, unspecified: Secondary | ICD-10-CM

## 2017-01-20 DIAGNOSIS — G8929 Other chronic pain: Secondary | ICD-10-CM | POA: Diagnosis not present

## 2017-01-20 DIAGNOSIS — L309 Dermatitis, unspecified: Secondary | ICD-10-CM

## 2017-01-20 MED ORDER — METHOCARBAMOL 750 MG PO TABS: 750.0000 mg | ORAL_TABLET | Freq: Four times a day (QID) | ORAL | 0 refills | Status: DC | PRN

## 2017-01-20 MED ORDER — CLOTRIMAZOLE 1 % EX CREA: 1.0000 "application " | TOPICAL_CREAM | Freq: Two times a day (BID) | CUTANEOUS | 0 refills | Status: DC

## 2017-01-20 MED ORDER — HYDROCORTISONE BUTYR LIPO BASE 0.1 % EX CREA: TOPICAL_CREAM | CUTANEOUS | 0 refills | Status: DC

## 2017-01-20 NOTE — Patient Instructions (Addendum)
Try locoid lipocream daily to 2x per day as needed left face avoid eye  Try clotrimazole left chest and zeasorb AF powder to keep you dry  Follow up with PCP in 2 months sooner if needed    Intertrigo Intertrigo is skin irritation or inflammation (dermatitis) that occurs when folds of skin rub together. The irritation can cause a rash and make skin raw and itchy. This condition most commonly occurs in the skin folds of these areas:  Toes.  Armpits.  Groin.  Belly.  Breasts.  Buttocks.  Intertrigo is not passed from person to person (is not contagious). What are the causes? This condition is caused by heat, moisture, friction, and lack of air circulation. The condition can be made worse by:  Sweat.  Bacteria or a fungus, such as yeast.  What increases the risk? This condition is more likely to occur if you have moisture in your skin folds. It is also more likely to develop in people who:  Have diabetes.  Are overweight.  Are on bed rest.  Live in a warm and moist climate.  Wear splints, braces, or other medical devices.  Are not able to control their bowels or bladder (have incontinence).  What are the signs or symptoms? Symptoms of this condition include:  A pink or red skin rash.  Brown patches on the skin.  Raw or scaly skin.  Itchiness.  A burning feeling.  Bleeding.  Leaking fluid.  A bad smell.  How is this diagnosed? This condition is diagnosed with a medical history and physical exam. You may also have a skin swab to test for bacteria or a fungus, such as yeast. How is this treated? Treatment may include:  Cleaning and drying your skin.  An oral antibiotic medicine or antibiotic skin cream for a bacterial infection.  Antifungal cream or pills for an infection that was caused by a fungus, such as yeast.  Steroid ointment to relieve itchiness and irritation.  Follow these instructions at home:  Keep the affected area clean and  dry.  Do not scratch your skin.  Stay in a cool environment as much as possible. Use an air conditioner or fan, if available.  Apply over-the-counter and prescription medicines only as told by your health care provider.  If you were prescribed an antibiotic medicine, use it as told by your health care provider. Do not stop using the antibiotic even if your condition improves.  Keep all follow-up visits as told by your health care provider. This is important. How is this prevented?  Maintain a healthy weight.  Take care of your feet, especially if you have diabetes. Foot care includes: ? Wearing shoes that fit well. ? Keeping your feet dry. ? Wearing clean, breathable socks.  Protect the skin around your groin and buttocks, especially if you have incontinence. Skin protection includes: ? Following a regular cleaning routine. ? Using moisturizers and skin protectants. ? Changing protection pads frequently.  Do not wear tight clothes. Wear clothes that are loose and absorbent. Wear clothes that are made of cotton.  Wear a bra that gives good support, if needed.  Shower and dry yourself thoroughly after activity. Use a hair dryer on a cool setting to dry between skin folds, especially after you bathe.  If you have diabetes, keep your blood sugar under control. Contact a health care provider if:  Your symptoms do not improve with treatment.  Your symptoms get worse or they spread.  You notice increased redness and  warmth.  You have a fever. This information is not intended to replace advice given to you by your health care provider. Make sure you discuss any questions you have with your health care provider. Document Released: 12/24/2004 Document Revised: 06/01/2015 Document Reviewed: 06/27/2014 Elsevier Interactive Patient Education  2018 [employer].  Eczema Eczema is a broad term for a group of skin conditions that cause skin to become rough and inflamed. Each type of  eczema has different triggers, symptoms, and treatments. Eczema of any type is usually itchy and symptoms range from mild to severe. Eczema and its symptoms are not spread from person to person (are not contagious). It can appear on different parts of the body at different times. Your eczema may not look the same as someone else's eczema. What are the types of eczema? Atopic dermatitis This is a long-term (chronic) skin disease that keeps coming back (recurring). Usual symptoms are dry skin and small, solid pimples that may swell and leak fluid (weep). Contact dermatitis This happens when something irritates the skin and causes a rash. The irritation can come from substances that you are allergic to (allergens), such as poison ivy, chemicals, or medicines that were applied to your skin. Dyshidrotic eczema This is a form of eczema on the hands and feet. It shows up as very itchy, fluid-filled blisters. It can affect people of any age, but is more common before age 28. Hand eczema This causes very itchy areas of skin on the palms and sides of the hands and fingers. This type of eczema is common in industrial jobs where you may be exposed to many different types of irritants. Lichen simplex chronicus This type of eczema occurs when a person constantly scratches one area of the body. Repeated scratching of the area leads to thickened skin (lichenification). Lichen simplex chronicus can occur along with other types of eczema. It is more common in adults, but may be seen in children as well. Nummular eczema This is a common type of eczema. It has no known cause. It typically causes a red, circular, crusty lesion (plaque) that may be itchy. Scratching may become a habit and can cause bleeding. Nummular eczema occurs most often in people of middle-age or older. It most often affects the hands. Seborrheic dermatitis This is a common skin disease that mainly affects the scalp. It may also affect any oily areas  of the body, such as the face, sides of nose, eyebrows, ears, eyelids, and chest. It is marked by small scaling and redness of the skin (erythema). This can affect people of all ages. In infants, this condition is known as Location manager." Stasis dermatitis This is a common skin disease that usually appears on the legs and feet. It most often occurs in people who have a condition that prevents blood from being pumped through the veins in the legs (chronic venous insufficiency). Stasis dermatitis is a chronic condition that needs long-term management. How is eczema diagnosed? Your health care provider will examine your skin and review your medical history. He or she may also give you skin patch tests. These tests involve taking patches that contain possible allergens and placing them on your back. He or she will then check in a few days to see if an allergic reaction occurred. What are the common treatments? Treatment for eczema is based on the type of eczema you have. Hydrocortisone steroid medicine can relieve itching quickly and help reduce inflammation. This medicine may be prescribed or obtained over-the-counter, depending  on the strength of the medicine that is needed. Follow these instructions at home:  Take over-the-counter and prescription medicines only as told by your health care provider.  Use creams or ointments to moisturize your skin. Do not use lotions.  Learn what triggers or irritates your symptoms. Avoid these things.  Treat symptom flare-ups quickly.  Do not itch your skin. This can make your rash worse.  Keep all follow-up visits as told by your health care provider. This is important. Where to find more information:  The American Academy of Dermatology: InfoExam.si  The National Eczema Association: www.nationaleczema.org Contact a health care provider if:  You have serious itching, even with treatment.  You regularly scratch your skin until it bleeds.  Your rash looks  different than usual.  Your skin is painful, swollen, or more red than usual.  You have a fever. Summary  There are eight general types of eczema. Each type has different triggers.  Eczema of any type causes itching that may range from mild to severe.  Treatment varies based on the type of eczema you have. Hydrocortisone steroid medicine can help with itching and inflammation.  Protecting your skin is the best way to prevent eczema. Use moisturizers and lotions. Avoid triggers and irritants, and treat flare-ups quickly. This information is not intended to replace advice given to you by your health care provider. Make sure you discuss any questions you have with your health care provider. Document Released: 05/09/2016 Document Revised: 05/09/2016 Document Reviewed: 05/09/2016 Elsevier Interactive Patient Education  2018 [employer].
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
## 2017-01-24 ENCOUNTER — Encounter: Payer: Self-pay | Admitting: Family

## 2017-03-21 ENCOUNTER — Encounter: Payer: Self-pay | Admitting: Internal Medicine

## 2017-03-21 ENCOUNTER — Ambulatory Visit: Payer: 59 | Admitting: Internal Medicine

## 2017-03-21 VITALS — BP 106/64 | HR 71 | Temp 97.9°F | Resp 15 | Ht 65.0 in | Wt 212.6 lb

## 2017-03-21 DIAGNOSIS — Z23 Encounter for immunization: Secondary | ICD-10-CM | POA: Diagnosis not present

## 2017-03-21 DIAGNOSIS — E032 Hypothyroidism due to medicaments and other exogenous substances: Secondary | ICD-10-CM

## 2017-03-21 DIAGNOSIS — Z6831 Body mass index (BMI) 31.0-31.9, adult: Secondary | ICD-10-CM

## 2017-03-21 DIAGNOSIS — R87610 Atypical squamous cells of undetermined significance on cytologic smear of cervix (ASC-US): Secondary | ICD-10-CM | POA: Diagnosis not present

## 2017-03-21 DIAGNOSIS — I10 Essential (primary) hypertension: Secondary | ICD-10-CM | POA: Diagnosis not present

## 2017-03-21 DIAGNOSIS — R928 Other abnormal and inconclusive findings on diagnostic imaging of breast: Secondary | ICD-10-CM | POA: Diagnosis not present

## 2017-03-21 DIAGNOSIS — F411 Generalized anxiety disorder: Secondary | ICD-10-CM | POA: Diagnosis not present

## 2017-03-21 DIAGNOSIS — L309 Dermatitis, unspecified: Secondary | ICD-10-CM

## 2017-03-21 DIAGNOSIS — F410 Panic disorder [episodic paroxysmal anxiety] without agoraphobia: Secondary | ICD-10-CM

## 2017-03-21 DIAGNOSIS — H01136 Eczematous dermatitis of left eye, unspecified eyelid: Secondary | ICD-10-CM

## 2017-03-21 DIAGNOSIS — E6609 Other obesity due to excess calories: Secondary | ICD-10-CM

## 2017-03-21 MED ORDER — LEVOTHYROXINE SODIUM 300 MCG PO TABS: 300.0000 ug | ORAL_TABLET | Freq: Every day | ORAL | 1 refills | Status: DC

## 2017-03-21 MED ORDER — SERTRALINE HCL 50 MG PO TABS: 75.0000 mg | ORAL_TABLET | Freq: Every day | ORAL | 2 refills | Status: DC

## 2017-03-21 MED ORDER — TRIAMTERENE-HCTZ 37.5-25 MG PO TABS: 1.0000 | ORAL_TABLET | Freq: Every day | ORAL | 1 refills | Status: DC
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
## 2017-03-21 NOTE — Patient Instructions (Addendum)
I have made a Dermatology referral for your unresolving eyelid rash   As long as you are losing weight,  We do not need to check thyroid or start phentermine back

## 2017-03-23 DIAGNOSIS — H01136 Eczematous dermatitis of left eye, unspecified eyelid: Secondary | ICD-10-CM | POA: Insufficient documentation

## 2017-03-23 NOTE — Assessment & Plan Note (Signed)
Recurrent despite use of Maxitrol ointment .  Referral to dermatology

## 2017-03-23 NOTE — Assessment & Plan Note (Signed)
Right breast asymmetry resolved with compressiom views Nov 2018

## 2017-03-23 NOTE — Assessment & Plan Note (Signed)
Reviewed her previous success at weight loss,  And ehr current progress using weight Watchers without phentermine. Her eventual goal weight for BMI < 25 is 150 lbs.  I have addressed  BMI and recommended wt loss of 10% of body weight over the next 6 months using a low glycemic index diet and regular exercise a minimum of 5 days per week.

## 2017-03-23 NOTE — Assessment & Plan Note (Signed)
Improved management with sertraline. Family has even commented on her improved mood. No changes today. 

## 2017-03-23 NOTE — Assessment & Plan Note (Signed)
Well controlled on current regimen. Renal function stable, no changes today. 

## 2017-03-23 NOTE — Assessment & Plan Note (Signed)
endometrial curettage in 2017 was non diagnostic due to paucity of endocervical cels

## 2017-09-05 ENCOUNTER — Other Ambulatory Visit: Payer: Self-pay

## 2017-09-05 ENCOUNTER — Ambulatory Visit: Payer: 59 | Admitting: Family Medicine

## 2017-09-05 ENCOUNTER — Encounter: Payer: Self-pay | Admitting: Family Medicine

## 2017-09-05 VITALS — BP 114/58 | HR 86 | Temp 98.1°F | Wt 216.8 lb

## 2017-09-05 DIAGNOSIS — J208 Acute bronchitis due to other specified organisms: Secondary | ICD-10-CM | POA: Diagnosis not present

## 2017-09-05 DIAGNOSIS — R05 Cough: Secondary | ICD-10-CM

## 2017-09-05 DIAGNOSIS — R059 Cough, unspecified: Secondary | ICD-10-CM

## 2017-09-05 MED ORDER — BENZONATATE 100 MG PO CAPS: 100.0000 mg | ORAL_CAPSULE | Freq: Three times a day (TID) | ORAL | 0 refills | Status: DC | PRN

## 2017-09-05 MED ORDER — PREDNISONE 20 MG PO TABS: 20.0000 mg | ORAL_TABLET | Freq: Every day | ORAL | 0 refills | Status: AC

## 2017-09-05 MED ORDER — GUAIFENESIN-CODEINE 100-10 MG/5ML PO SOLN: 5.0000 mL | Freq: Four times a day (QID) | ORAL | 0 refills | Status: DC | PRN

## 2017-09-05 MED ORDER — ALBUTEROL SULFATE HFA 108 (90 BASE) MCG/ACT IN AERS
2.0000 | INHALATION_SPRAY | Freq: Four times a day (QID) | RESPIRATORY_TRACT | 0 refills | Status: DC | PRN
Start: 2017-09-05 — End: 2017-09-22

## 2017-09-05 NOTE — Progress Notes (Signed)
   Subjective:    Patient ID: [name], female    DOB: 02/11/1975, 42 y.o.   MRN: [id number]  HPI   Patient presents to clinic due to cough for 1 week.  States cough is mostly dry and harsh, but she will have some white and yellow phlegm in the a.m.  No fever or chills. She took a dose of her daughters albuterol inhaler, this seemed to improve her symptoms.  Has no hx of asthma or COPD  Patient Active Problem List   Diagnosis Date Noted  . Eczema of eyelid, left 03/23/2017  . Generalized anxiety disorder with panic attacks 08/03/2016  . Chronic back pain 05/02/2016  . Obesity 02/14/2014  . Essential hypertension 11/14/2013  . Atypical squamous cells of undetermined significance (ASCUS) on Papanicolaou smear of cervix 02/04/2013  . Iatrogenic hypothyroidism 02/04/2013  . Encounter for preventive health examination 01/31/2013  . Graves disease 11/26/2012  . Abnormal mammogram 10/14/2012  . Colitis, nonspecific 10/03/2012  . Edema 10/03/2012  . Paroxysmal SVT (supraventricular tachycardia) (HCC) 10/03/2012   Social History   Tobacco Use  . Smoking status: Never Smoker  . Smokeless tobacco: Never Used  Substance Use Topics  . Alcohol use: Yes    Review of Systems  Constitutional: Negative for chills, fatigue and fever.  HENT: Negative for congestion, ear pain, sinus pain and sore throat.   Eyes: Negative.   Respiratory: +cough, some SOB (improved after used daughters albuterol), occasional white or yellow phlegm.   Cardiovascular: Negative for chest pain, palpitations and leg swelling.  Gastrointestinal: Negative for abdominal pain, diarrhea, nausea and vomiting.  Genitourinary: Negative for dysuria, frequency and urgency.  Musculoskeletal: Negative for arthralgias and myalgias.  Skin: Negative for color change, pallor and rash.  Neurological: Negative for syncope, light-headedness and headaches.  Psychiatric/Behavioral: The patient is not nervous/anxious.         Objective:   Physical Exam  Constitutional: She is oriented to person, place, and time. She appears well-developed and well-nourished. No distress.  Eyes: Conjunctivae and EOM are normal. No scleral icterus.  Neck: Normal range of motion. Neck supple. No tracheal deviation present.  Cardiovascular: Normal rate and regular rhythm.  Pulmonary/Chest: Effort normal and breath sounds normal. No respiratory distress. She has no wheezes. She has no rales.  Musculoskeletal:  Gait normal  Neurological: She is alert and oriented to person, place, and time.  Skin: Skin is warm and dry. No pallor.  Psychiatric: She has a normal mood and affect. Her behavior is normal.  Nursing note and vitals reviewed.  Vitals:   09/05/17 1408  BP: (!) 114/58  Pulse: 86  Temp: 98.1 F (36.7 C)  SpO2: 97%    Assessment & Plan:   Viral Bronchitis -- due to cough responding to dose of albuterol and some white phlegm suspect a viral bronchitis.  Patient will take prednisone burst, use albuterol inhaler and medication for cough.  Patient advised that if cough does not clear up with this course of treatment she can consider chest x-ray and/or treatment for heartburn as heartburn can sometimes cause a persistent cough.    Keep regular follow-up as already scheduled.

## 2017-09-05 NOTE — Patient Instructions (Signed)
Great to meet you! 

## 2017-09-09 ENCOUNTER — Other Ambulatory Visit: Payer: Self-pay | Admitting: Internal Medicine

## 2017-09-09 DIAGNOSIS — I10 Essential (primary) hypertension: Secondary | ICD-10-CM

## 2017-09-22 ENCOUNTER — Encounter: Payer: Self-pay | Admitting: Internal Medicine

## 2017-09-22 ENCOUNTER — Ambulatory Visit: Payer: 59 | Admitting: Internal Medicine

## 2017-09-22 VITALS — BP 112/78 | HR 69 | Temp 98.5°F | Resp 15 | Ht 65.0 in | Wt 221.8 lb

## 2017-09-22 DIAGNOSIS — E032 Hypothyroidism due to medicaments and other exogenous substances: Secondary | ICD-10-CM

## 2017-09-22 DIAGNOSIS — I10 Essential (primary) hypertension: Secondary | ICD-10-CM | POA: Diagnosis not present

## 2017-09-22 DIAGNOSIS — E6609 Other obesity due to excess calories: Secondary | ICD-10-CM

## 2017-09-22 DIAGNOSIS — E66811 Other obesity due to excess calories: Secondary | ICD-10-CM

## 2017-09-22 DIAGNOSIS — Z23 Encounter for immunization: Secondary | ICD-10-CM | POA: Diagnosis not present

## 2017-09-22 DIAGNOSIS — R05 Cough: Secondary | ICD-10-CM

## 2017-09-22 DIAGNOSIS — Z6831 Body mass index (BMI) 31.0-31.9, adult: Secondary | ICD-10-CM

## 2017-09-22 DIAGNOSIS — E0500 Thyrotoxicosis with diffuse goiter without thyrotoxic crisis or storm: Secondary | ICD-10-CM | POA: Diagnosis not present

## 2017-09-22 DIAGNOSIS — R059 Cough, unspecified: Secondary | ICD-10-CM

## 2017-09-22 MED ORDER — BENZONATATE 200 MG PO CAPS: 200.0000 mg | ORAL_CAPSULE | Freq: Three times a day (TID) | ORAL | 2 refills | Status: DC | PRN
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
## 2017-09-22 NOTE — Patient Instructions (Addendum)
For your cough:  It may be persistent due to post nasal drip.  Let's try Generic benadryl 12.5 to 25 mg nightly one hour before bedtime   Continue Tessalon 200 mg (at the higher higher dose!) every 8 hours  Continue allegra daily for allergies

## 2017-09-22 NOTE — Assessment & Plan Note (Signed)
s/p ablation. Marland Kitchen. Has not had thyroid checked in nearly a year.    Lab Results  Component Value Date   TSH 1.81 12/06/2016

## 2017-09-22 NOTE — Assessment & Plan Note (Signed)
Post viral.  exam is normal. Adding benadryl for persistent PND  And resume tessalon at 200 mg tid.
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
## 2017-09-23 LAB — T4, FREE: FREE T4: 1.17 ng/dL (ref 0.60–1.60)

## 2017-09-23 LAB — COMPREHENSIVE METABOLIC PANEL
ALT: 8 U/L (ref 0–35)
AST: 12 U/L (ref 0–37)
Albumin: 4.4 g/dL (ref 3.5–5.2)
Alkaline Phosphatase: 46 U/L (ref 39–117)
BILIRUBIN TOTAL: 1.1 mg/dL (ref 0.2–1.2)
BUN: 17 mg/dL (ref 6–23)
CO2: 26 mEq/L (ref 19–32)
Calcium: 9.8 mg/dL (ref 8.4–10.5)
Chloride: 100 mEq/L (ref 96–112)
Creatinine, Ser: 0.87 mg/dL (ref 0.40–1.20)
GFR: 75.91 mL/min (ref >60.00)
Glucose, Bld: 85 mg/dL (ref 70–99)
Potassium: 3.6 mEq/L (ref 3.5–5.1)
Sodium: 136 mEq/L (ref 135–145)
Total Protein: 7.2 g/dL (ref 6.0–8.3)

## 2017-09-23 LAB — TSH: TSH: 1.28 u[IU]/mL (ref 0.35–4.50)

## 2017-10-28 ENCOUNTER — Other Ambulatory Visit: Payer: Self-pay | Admitting: Internal Medicine

## 2017-10-28 DIAGNOSIS — E032 Hypothyroidism due to medicaments and other exogenous substances: Secondary | ICD-10-CM

## 2017-10-29 DIAGNOSIS — Z1239 Encounter for other screening for malignant neoplasm of breast: Secondary | ICD-10-CM

## 2017-11-04 DIAGNOSIS — Z1231 Encounter for screening mammogram for malignant neoplasm of breast: Secondary | ICD-10-CM | POA: Diagnosis not present

## 2017-11-04 LAB — HM MAMMOGRAPHY

## 2017-12-12 ENCOUNTER — Other Ambulatory Visit: Payer: Self-pay

## 2017-12-12 DIAGNOSIS — G8929 Other chronic pain: Secondary | ICD-10-CM

## 2017-12-12 DIAGNOSIS — M549 Dorsalgia, unspecified: Principal | ICD-10-CM

## 2017-12-12 MED ORDER — METHOCARBAMOL 750 MG PO TABS: 750.0000 mg | ORAL_TABLET | Freq: Four times a day (QID) | ORAL | 0 refills | Status: DC | PRN

## 2017-12-12 NOTE — Telephone Encounter (Signed)
Refilled: 01/20/2017 Last OV: 09/22/2017 Next OV: not scheduled

## 2018-03-26 ENCOUNTER — Other Ambulatory Visit: Payer: Self-pay | Admitting: Internal Medicine

## 2018-03-26 DIAGNOSIS — I10 Essential (primary) hypertension: Secondary | ICD-10-CM

## 2018-04-16 ENCOUNTER — Other Ambulatory Visit: Payer: Self-pay | Admitting: Internal Medicine

## 2018-04-16 DIAGNOSIS — E032 Hypothyroidism due to medicaments and other exogenous substances: Secondary | ICD-10-CM

## 2018-04-21 DIAGNOSIS — I10 Essential (primary) hypertension: Secondary | ICD-10-CM

## 2018-04-21 DIAGNOSIS — E032 Hypothyroidism due to medicaments and other exogenous substances: Secondary | ICD-10-CM

## 2018-04-21 MED ORDER — LEVOTHYROXINE SODIUM 300 MCG PO TABS: ORAL_TABLET | ORAL | 0 refills | Status: DC

## 2018-04-21 MED ORDER — TRIAMTERENE-HCTZ 37.5-25 MG PO TABS
1.0000 | ORAL_TABLET | Freq: Every day | ORAL | 0 refills | Status: DC
Start: 2018-04-21 — End: 2018-06-29

## 2018-06-27 ENCOUNTER — Other Ambulatory Visit: Payer: Self-pay | Admitting: Internal Medicine

## 2018-06-27 DIAGNOSIS — I10 Essential (primary) hypertension: Secondary | ICD-10-CM

## 2018-09-13 ENCOUNTER — Other Ambulatory Visit: Payer: Self-pay | Admitting: Internal Medicine

## 2018-09-13 DIAGNOSIS — I10 Essential (primary) hypertension: Secondary | ICD-10-CM

## 2018-09-17 DIAGNOSIS — E0500 Thyrotoxicosis with diffuse goiter without thyrotoxic crisis or storm: Secondary | ICD-10-CM

## 2018-09-17 DIAGNOSIS — I10 Essential (primary) hypertension: Secondary | ICD-10-CM

## 2018-10-03 ENCOUNTER — Other Ambulatory Visit: Payer: Self-pay | Admitting: Internal Medicine

## 2018-10-03 DIAGNOSIS — E032 Hypothyroidism due to medicaments and other exogenous substances: Secondary | ICD-10-CM

## 2018-11-04 ENCOUNTER — Other Ambulatory Visit: Payer: Self-pay

## 2018-11-04 ENCOUNTER — Other Ambulatory Visit (INDEPENDENT_AMBULATORY_CARE_PROVIDER_SITE_OTHER): Payer: 59

## 2018-11-04 DIAGNOSIS — I10 Essential (primary) hypertension: Secondary | ICD-10-CM | POA: Diagnosis not present

## 2018-11-04 DIAGNOSIS — E0500 Thyrotoxicosis with diffuse goiter without thyrotoxic crisis or storm: Secondary | ICD-10-CM

## 2018-11-04 LAB — COMPREHENSIVE METABOLIC PANEL
ALT: 8 U/L (ref 0–35)
AST: 10 U/L (ref 0–37)
Albumin: 4.4 g/dL (ref 3.5–5.2)
Alkaline Phosphatase: 48 U/L (ref 39–117)
BUN: 17 mg/dL (ref 6–23)
CO2: 27 mEq/L (ref 19–32)
Calcium: 9.2 mg/dL (ref 8.4–10.5)
Chloride: 103 mEq/L (ref 96–112)
Creatinine, Ser: 0.73 mg/dL (ref 0.40–1.20)
GFR: 86.99 mL/min (ref >60.00)
Glucose, Bld: 107 mg/dL — ABNORMAL HIGH (ref 70–99)
Potassium: 3.5 mEq/L (ref 3.5–5.1)
Sodium: 139 mEq/L (ref 135–145)
Total Bilirubin: 0.8 mg/dL (ref 0.2–1.2)
Total Protein: 6.8 g/dL (ref 6.0–8.3)

## 2018-11-05 LAB — THYROID PANEL WITH TSH
Free Thyroxine Index: 3.5 (ref 1.4–3.8)
T3 Uptake: 31 % (ref 22–35)
T4, Total: 11.2 ug/dL (ref 5.1–11.9)
TSH: 3.56 mIU/L

## 2018-11-09 LAB — HM MAMMOGRAPHY

## 2018-12-13 ENCOUNTER — Other Ambulatory Visit: Payer: Self-pay | Admitting: Internal Medicine

## 2018-12-13 DIAGNOSIS — I10 Essential (primary) hypertension: Secondary | ICD-10-CM

## 2018-12-14 NOTE — Telephone Encounter (Signed)
Pt has not been seen since 09/2017.

## 2019-02-04 ENCOUNTER — Other Ambulatory Visit: Payer: Self-pay

## 2019-02-04 DIAGNOSIS — G8929 Other chronic pain: Secondary | ICD-10-CM

## 2019-02-04 DIAGNOSIS — E032 Hypothyroidism due to medicaments and other exogenous substances: Secondary | ICD-10-CM

## 2019-02-04 MED ORDER — LEVOTHYROXINE SODIUM 300 MCG PO TABS: 300.0000 ug | ORAL_TABLET | Freq: Every day | ORAL | 0 refills | Status: DC

## 2019-02-08 MED ORDER — METHOCARBAMOL 750 MG PO TABS: 750.0000 mg | ORAL_TABLET | Freq: Four times a day (QID) | ORAL | 0 refills | Status: DC | PRN

## 2019-02-08 NOTE — Telephone Encounter (Signed)
Refilled: 12/12/2017 Last OV: 09/22/2017 Next OV: not scheduled

## 2019-03-09 ENCOUNTER — Ambulatory Visit: Payer: 59 | Admitting: Internal Medicine

## 2019-03-10 ENCOUNTER — Other Ambulatory Visit: Payer: Self-pay

## 2019-03-12 ENCOUNTER — Other Ambulatory Visit: Payer: Self-pay

## 2019-03-12 ENCOUNTER — Ambulatory Visit: Payer: 59 | Admitting: Internal Medicine

## 2019-03-12 ENCOUNTER — Other Ambulatory Visit (HOSPITAL_COMMUNITY)
Admission: RE | Admit: 2019-03-12 | Discharge: 2019-03-12 | Disposition: A | Discharge status: Home / Self Care | Payer: 59 | Source: Ambulatory Visit | Attending: Internal Medicine | Admitting: Internal Medicine

## 2019-03-12 ENCOUNTER — Encounter: Payer: Self-pay | Admitting: Internal Medicine

## 2019-03-12 VITALS — BP 106/70 | HR 87 | Temp 97.1°F | Resp 14 | Ht 65.0 in | Wt 220.2 lb

## 2019-03-12 DIAGNOSIS — Z0000 Encounter for general adult medical examination without abnormal findings: Secondary | ICD-10-CM | POA: Diagnosis not present

## 2019-03-12 DIAGNOSIS — Z124 Encounter for screening for malignant neoplasm of cervix: Secondary | ICD-10-CM

## 2019-03-12 DIAGNOSIS — E0500 Thyrotoxicosis with diffuse goiter without thyrotoxic crisis or storm: Secondary | ICD-10-CM

## 2019-03-12 DIAGNOSIS — E032 Hypothyroidism due to medicaments and other exogenous substances: Secondary | ICD-10-CM | POA: Diagnosis not present

## 2019-03-12 DIAGNOSIS — E669 Obesity, unspecified: Secondary | ICD-10-CM | POA: Diagnosis not present

## 2019-03-12 NOTE — Progress Notes (Signed)
Patient ID: [name], female    DOB: 1975-12-03  Age: 44 y.o. MRN: [id number]  The patient is here for annual  examination and management of other chronic and acute problems.    This visit occurred during the SARS-CoV-2 public health emergency.  Safety protocols were in place, including screening questions prior to the visit, additional usage of staff PPE, and extensive cleaning of exam room while observing appropriate contact time as indicated for disinfecting solutions.   The risk factors are reflected in the social history.  The roster of all physicians providing medical care to patient - is listed in the Snapshot section of the chart.  Activities of daily living:  The patient is 100% independent in all ADLs: dressing, toileting, feeding as well as independent mobility  Home safety : The patient has smoke detectors in the home. They wear seatbelts.  There are no firearms at home. There is no violence in the home.   There is no risks for hepatitis, STDs or HIV. There is no   history of blood transfusion. They have no travel history to infectious disease endemic areas of the world.  The patient has seen their dentist in the last six month. They have seen their eye doctor in the last year. They admit to slight hearing difficulty with regard to whispered voices and some television programs.  They have deferred audiologic testing in the last year.  They do not  have excessive sun exposure. Discussed the need for sun protection: hats, long sleeves and use of sunscreen if there is significant sun exposure.   Diet: the importance of a healthy diet is discussed. They do have a healthy diet.  The benefits of regular aerobic exercise were discussed. She walks 4 times per week ,  20 minutes.   Depression screen: there are no signs or vegative symptoms of depression- irritability, change in appetite, anhedonia, sadness/tearfullness.  The following portions of the patient's history were reviewed and  updated as appropriate: allergies, current medications, past family history, past medical history,  past surgical history, past social history  and problem list.  Visual acuity was not assessed per patient preference since she has regular follow up with her ophthalmologist. Hearing and body mass index were assessed and reviewed.   During the course of the visit the patient was educated and counseled about appropriate screening and preventive services including : fall prevention , diabetes screening, nutrition counseling, colorectal cancer screening, and recommended immunizations.    CC: The primary encounter diagnosis was Iatrogenic hypothyroidism. Diagnoses of Obesity (BMI 30.0-34.9), Cervical cancer screening, Encounter for preventive health examination, and Graves disease were also pertinent to this visit.  1) struggling with weight gain. Lost 20 lbs with KETO diet but gained during COVID due to dietary indiscretions.  Last thyroid check  was normal in November.    History Asli has a past medical history of Allergy, Arthritis, Asthma, Bradycardia, Chicken pox, Colitis (2004), Colon polyps, Dysrhythmia, Graves disease, Headache, Heart murmur, Hypertension, Hypothyroidism, Irritable bowel syndrome (IBS) (2004), MVP (mitral valve prolapse), Palpitations (2000), PONV (postoperative nausea and vomiting), Swelling of both hands, Swelling of both lower extremities, and Thyroid disease.   She has a past surgical history that includes Cholecystectomy (2001); Tonsilectomy/adenoidectomy with myringotomy; Cesarean section (0626,9485); Tubal ligation (2010); Carpal tunnel release (Left, 2012); transthoracic echocardiogram; radioactive iodine (2014); Colonoscopy; Myringotomy; and Dilatation & currettage/hysteroscopy with novasure ablation (N/A, 04/07/2015).   Her family history includes Alcohol abuse in her mother; Arthritis in her father and  mother; Heart disease in her maternal grandfather and paternal  grandfather; Hyperlipidemia in her mother; Hypertension in her mother; Stroke in her maternal grandfather.She reports that she has never smoked. She has never used smokeless tobacco. She reports current alcohol use. She reports that she does not use drugs.  Outpatient Medications Prior to Visit  Medication Sig Dispense Refill  . fexofenadine (ALLEGRA) 180 MG tablet Take 180 mg by mouth daily.    Marland Kitchen ibuprofen (ADVIL,MOTRIN) 600 MG tablet Take 1 tablet (600 mg total) every 6 (six) hours as needed by mouth. 30 tablet 0  . levothyroxine (SYNTHROID) 300 MCG tablet Take 1 tablet (300 mcg total) by mouth daily before breakfast. 90 tablet 0  . methocarbamol (ROBAXIN-750) 750 MG tablet Take 1 tablet (750 mg total) by mouth 4 (four) times daily as needed for muscle spasms. 30 tablet 0  . sertraline (ZOLOFT) 50 MG tablet TAKE 1 AND 1/2 TABLETS BY  MOUTH DAILY 135 tablet 3  . triamterene-hydrochlorothiazide (MAXZIDE-25) 37.5-25 MG tablet TAKE 1 TABLET BY MOUTH  DAILY 90 tablet 3  . benzonatate (TESSALON) 200 MG capsule Take 1 capsule (200 mg total) by mouth 3 (three) times daily as needed for cough. (Patient not taking: Reported on 03/12/2019) 30 capsule 2   No facility-administered medications prior to visit.    Review of Systems   Patient denies headache, fevers, malaise, unintentional weight loss, skin rash, eye pain, sinus congestion and sinus pain, sore throat, dysphagia,  hemoptysis , cough, dyspnea, wheezing, chest pain, palpitations, orthopnea, edema, abdominal pain, nausea, melena, diarrhea, constipation, flank pain, dysuria, hematuria, urinary  Frequency, nocturia, numbness, tingling, seizures,  Focal weakness, Loss of consciousness,  Tremor, insomnia, depression, anxiety, and suicidal ideation.      Objective:  BP 106/70 (BP Location: Left Arm, Patient Position: Sitting, Cuff Size: Large)   Pulse 87   Temp (!) 97.1 F (36.2 C) (Temporal)   Resp 14   Ht 5\' 5"  (1.651 m)   Wt 220 lb 3.2 oz (99.9  kg)   SpO2 97%   BMI 36.64 kg/m   Physical Exam General Appearance:    Alert, cooperative, no distress, appears stated age  Head:    Normocephalic, without obvious abnormality, atraumatic  Eyes:    PERRL, conjunctiva/corneas clear, EOM's intact, fundi    benign, both eyes  Ears:    Normal TM's and external ear canals, both ears  Nose:   Nares normal, septum midline, mucosa normal, no drainage    or sinus tenderness  Throat:   Lips, mucosa, and tongue normal; teeth and gums normal  Neck:   Supple, symmetrical, trachea midline, no adenopathy;    thyroid:  no enlargement/tenderness/nodules; no carotid   bruit or JVD  Back:     Symmetric, no curvature, ROM normal, no CVA tenderness  Lungs:     Clear to auscultation bilaterally, respirations unlabored  Chest Wall:    No tenderness or deformity   Heart:    Regular rate and rhythm, S1 and S2 normal, no murmur, rub   or gallop  Breast Exam:    No tenderness, masses, or nipple abnormality  Abdomen:     Soft, non-tender, bowel sounds active all four quadrants,    no masses, no organomegaly  Genitalia:    Pelvic: cervix normal in appearance, external genitalia normal, no adnexal masses or tenderness, no cervical motion tenderness, rectovaginal septum normal, uterus normal size, shape, and consistency and vagina normal without discharge  Extremities:   Extremities normal, atraumatic, no  cyanosis or edema  Pulses:   2+ and symmetric all extremities  Skin:   Skin color, texture, turgor normal, no rashes or lesions  Lymph nodes:   Cervical, supraclavicular, and axillary nodes normal  Neurologic:   CNII-XII intact, normal strength, sensation and reflexes    throughout      Assessment & Plan:   Problem List Items Addressed This Visit      Unprioritized   Graves disease    s/p ablation for Grave's Disease .  TSH is normal on 300 mc dose .  No changes today .    Lab Results  Component Value Date   TSH 1.47 03/12/2019         Encounter  for preventive health examination    age appropriate education and counseling updated, referrals for preventative services and immunizations addressed, dietary and smoking counseling addressed, most recent labs reviewed.  I have personally reviewed and have noted:  1) the patient's medical and social history 2) The pt's use of alcohol, tobacco, and illicit drugs 3) The patient's current medications and supplements 4) Functional ability including ADL's, fall risk, home safety risk, hearing and visual impairment 5) Diet and physical activities 6) Evidence for depression or mood disorder 7) The patient's height, weight, and BMI have been recorded in the chart  I have made referrals, and provided counseling and education based on review of the above      Iatrogenic hypothyroidism - Primary   Relevant Orders   TSH (Completed)    Other Visit Diagnoses    Obesity (BMI 30.0-34.9)       Relevant Orders   Hemoglobin A1c (Completed)   Comprehensive metabolic panel (Completed)   Lipid panel (Completed)   Direct LDL (Completed)   Cervical cancer screening       Relevant Orders   Cytology - PAP( [place])      I am having Asyah L. Ollinger maintain her fexofenadine, ibuprofen, benzonatate, sertraline, triamterene-hydrochlorothiazide, methocarbamol, and levothyroxine.  No orders of the defined types were placed in this encounter.   There are no discontinued medications.  Follow-up: No follow-ups on file.   Sherlene Shams, MD

## 2019-03-12 NOTE — Patient Instructions (Signed)
Health Maintenance, Female Adopting a healthy lifestyle and getting preventive care are important in promoting health and wellness. Ask your health care provider about:  The right schedule for you to have regular tests and exams.  Things you can do on your own to prevent diseases and keep yourself healthy. What should I know about diet, weight, and exercise? Eat a healthy diet   Eat a diet that includes plenty of vegetables, fruits, low-fat dairy products, and lean protein.  Do not eat a lot of foods that are high in solid fats, added sugars, or sodium. Maintain a healthy weight Body mass index (BMI) is used to identify weight problems. It estimates body fat based on height and weight. Your health care provider can help determine your BMI and help you achieve or maintain a healthy weight. Get regular exercise Get regular exercise. This is one of the most important things you can do for your health. Most adults should:  Exercise for at least 150 minutes each week. The exercise should increase your heart rate and make you sweat (moderate-intensity exercise).  Do strengthening exercises at least twice a week. This is in addition to the moderate-intensity exercise.  Spend less time sitting. Even light physical activity can be beneficial. Watch cholesterol and blood lipids Have your blood tested for lipids and cholesterol at 44 years of age, then have this test every 5 years. Have your cholesterol levels checked more often if:  Your lipid or cholesterol levels are high.  You are older than 44 years of age.  You are at high risk for heart disease. What should I know about cancer screening? Depending on your health history and family history, you may need to have cancer screening at various ages. This may include screening for:  Breast cancer.  Cervical cancer.  Colorectal cancer.  Skin cancer.  Lung cancer. What should I know about heart disease, diabetes, and high blood  pressure? Blood pressure and heart disease  High blood pressure causes heart disease and increases the risk of stroke. This is more likely to develop in people who have high blood pressure readings, are of African descent, or are overweight.  Have your blood pressure checked: ? Every 3-5 years if you are 18-39 years of age. ? Every year if you are 40 years old or older. Diabetes Have regular diabetes screenings. This checks your fasting blood sugar level. Have the screening done:  Once every three years after age 40 if you are at a normal weight and have a low risk for diabetes.  More often and at a younger age if you are overweight or have a high risk for diabetes. What should I know about preventing infection? Hepatitis B If you have a higher risk for hepatitis B, you should be screened for this virus. Talk with your health care provider to find out if you are at risk for hepatitis B infection. Hepatitis C Testing is recommended for:  Everyone born from 1945 through 1965.  Anyone with known risk factors for hepatitis C. Sexually transmitted infections (STIs)  Get screened for STIs, including gonorrhea and chlamydia, if: ? You are sexually active and are younger than 44 years of age. ? You are older than 44 years of age and your health care provider tells you that you are at risk for this type of infection. ? Your sexual activity has changed since you were last screened, and you are at increased risk for chlamydia or gonorrhea. Ask your health care provider if   you are at risk.  Ask your health care provider about whether you are at high risk for HIV. Your health care provider may recommend a prescription medicine to help prevent HIV infection. If you choose to take medicine to prevent HIV, you should first get tested for HIV. You should then be tested every 3 months for as long as you are taking the medicine. Pregnancy  If you are about to stop having your period (premenopausal) and  you may become pregnant, seek counseling before you get pregnant.  Take 400 to 800 micrograms (mcg) of folic acid every day if you become pregnant.  Ask for birth control (contraception) if you want to prevent pregnancy. Osteoporosis and menopause Osteoporosis is a disease in which the bones lose minerals and strength with aging. This can result in bone fractures. If you are 65 years old or older, or if you are at risk for osteoporosis and fractures, ask your health care provider if you should:  Be screened for bone loss.  Take a calcium or vitamin D supplement to lower your risk of fractures.  Be given hormone replacement therapy (HRT) to treat symptoms of menopause. Follow these instructions at home: Lifestyle  Do not use any products that contain nicotine or tobacco, such as cigarettes, e-cigarettes, and chewing tobacco. If you need help quitting, ask your health care provider.  Do not use street drugs.  Do not share needles.  Ask your health care provider for help if you need support or information about quitting drugs. Alcohol use  Do not drink alcohol if: ? Your health care provider tells you not to drink. ? You are pregnant, may be pregnant, or are planning to become pregnant.  If you drink alcohol: ? Limit how much you use to 0-1 drink a day. ? Limit intake if you are breastfeeding.  Be aware of how much alcohol is in your drink. In the [place] one drink equals one 12 oz bottle of beer (355 mL), one 5 oz glass of wine (148 mL), or one 1 oz glass of hard liquor (44 mL). General instructions  Schedule regular health, dental, and eye exams.  Stay current with your vaccines.  Tell your health care provider if: ? You often feel depressed. ? You have ever been abused or do not feel safe at home. Summary  Adopting a healthy lifestyle and getting preventive care are important in promoting health and wellness.  Follow your health care provider's instructions about healthy  diet, exercising, and getting tested or screened for diseases.  Follow your health care provider's instructions on monitoring your cholesterol and blood pressure. This information is not intended to replace advice given to you by your health care provider. Make sure you discuss any questions you have with your health care provider. Document Revised: 12/17/2017 Document Reviewed: 12/17/2017 Elsevier Patient Education  2020 [employer] Inc[employer]  

## 2019-03-13 LAB — TSH: TSH: 1.47 mIU/L

## 2019-03-13 LAB — LIPID PANEL
Cholesterol: 152 mg/dL (ref <200)
HDL: 45 mg/dL — ABNORMAL LOW (ref >=50)
LDL Cholesterol (Calc): 84 mg/dL (calc)
Non-HDL Cholesterol (Calc): 107 mg/dL (calc) (ref <130)
Total CHOL/HDL Ratio: 3.4 (calc) (ref <5.0)
Triglycerides: 130 mg/dL (ref <150)

## 2019-03-13 LAB — COMPREHENSIVE METABOLIC PANEL
AG Ratio: 2 (calc) (ref 1.0–2.5)
ALT: 9 U/L (ref 6–29)
AST: 11 U/L (ref 10–30)
Albumin: 4.3 g/dL (ref 3.6–5.1)
Alkaline phosphatase (APISO): 46 U/L (ref 31–125)
BUN: 19 mg/dL (ref 7–25)
CO2: 27 mmol/L (ref 20–32)
Calcium: 9.2 mg/dL (ref 8.6–10.2)
Chloride: 101 mmol/L (ref 98–110)
Creat: 0.77 mg/dL (ref 0.50–1.10)
Globulin: 2.2 g/dL (calc) (ref 1.9–3.7)
Glucose, Bld: 89 mg/dL (ref 65–99)
Potassium: 3.4 mmol/L — ABNORMAL LOW (ref 3.5–5.3)
Sodium: 140 mmol/L (ref 135–146)
Total Bilirubin: 0.8 mg/dL (ref 0.2–1.2)
Total Protein: 6.5 g/dL (ref 6.1–8.1)

## 2019-03-13 LAB — HEMOGLOBIN A1C
Hgb A1c MFr Bld: 4.1 % of total Hgb (ref <5.7)
Mean Plasma Glucose: 71 (calc)
eAG (mmol/L): 3.9 (calc)

## 2019-03-13 LAB — LDL CHOLESTEROL, DIRECT: Direct LDL: 95 mg/dL (ref <100)

## 2019-03-14 ENCOUNTER — Other Ambulatory Visit: Payer: Self-pay | Admitting: Internal Medicine

## 2019-03-14 MED ORDER — POTASSIUM CHLORIDE CRYS ER 20 MEQ PO TBCR: 20.0000 meq | EXTENDED_RELEASE_TABLET | Freq: Every day | ORAL | 0 refills | Status: DC

## 2019-03-14 NOTE — Assessment & Plan Note (Signed)
s/p ablation for Grave's Disease .  TSH is normal on 300 mc dose .  No changes today .    Lab Results  Component Value Date   TSH 1.47 03/12/2019

## 2019-03-14 NOTE — Assessment & Plan Note (Signed)

## 2019-03-17 LAB — CYTOLOGY - PAP
Comment: NEGATIVE
Diagnosis: NEGATIVE
High risk HPV: NEGATIVE

## 2019-03-31 ENCOUNTER — Other Ambulatory Visit: Payer: Self-pay | Admitting: Internal Medicine

## 2019-03-31 DIAGNOSIS — E032 Hypothyroidism due to medicaments and other exogenous substances: Secondary | ICD-10-CM

## 2019-04-19 MED ORDER — FLUTICASONE PROPIONATE 50 MCG/ACT NA SUSP: 2.0000 | Freq: Every day | NASAL | 2 refills | Status: AC

## 2019-10-22 ENCOUNTER — Telehealth: Payer: 59 | Admitting: Orthopedic Surgery

## 2019-10-22 DIAGNOSIS — M546 Pain in thoracic spine: Secondary | ICD-10-CM | POA: Diagnosis not present

## 2019-10-22 NOTE — Progress Notes (Signed)
We are sorry that you are not feeling well.  Here is how we plan to help!  Based on what you have shared with me it looks like you mostly have acute back pain.  Acute back pain is defined as musculoskeletal pain that can resolve in 1-3 weeks with conservative treatment.  You have nearly exhausted what we can do for acute back pain in this format (evisits). I recommend you try Voltaren Gel (you can get this over the counter) in addition to your NSAID and muscle relaxer. If you don't get relief from that over the weekend I suggest you arrange to be seen in person on Monday. Some patients experience stomach irritation or in increased heartburn with anti-inflammatory drugs.  Please keep in mind that muscle relaxer's can cause fatigue and should not be taken while at work or driving.  Back pain is very common.  The pain often gets better over time.  The cause of back pain is usually not dangerous.  Most people can learn to manage their back pain on their own.  Home Care  Stay active.  Start with short walks on flat ground if you can.  Try to walk farther each day.  Do not sit, drive or stand in one place for more than 30 minutes.  Do not stay in bed.  Do not avoid exercise or work.  Activity can help your back heal faster.  Be careful when you bend or lift an object.  Bend at your knees, keep the object close to you, and do not twist.  Sleep on a firm mattress.  Lie on your side, and bend your knees.  If you lie on your back, put a pillow under your knees.  Only take medicines as told by your doctor.  Put ice on the injured area.  Put ice in a plastic bag  Place a towel between your skin and the bag  Leave the ice on for 15-20 minutes, 3-4 times a day for the first 2-3 days. 210 After that, you can switch between ice and heat packs.  Ask your doctor about back exercises or massage.  Avoid feeling anxious or stressed.  Find good ways to deal with stress, such as exercise.  Get Help Right  Way If:  Your pain does not go away with rest or medicine.  Your pain does not go away in 1 week.  You have new problems.  You do not feel well.  The pain spreads into your legs.  You cannot control when you poop (bowel movement) or pee (urinate)  You feel sick to your stomach (nauseous) or throw up (vomit)  You have belly (abdominal) pain.  You feel like you may pass out (faint).  If you develop a fever.  Make Sure you:  Understand these instructions.  Will watch your condition  Will get help right away if you are not doing well or get worse.  Your e-visit answers were reviewed by a board certified advanced clinical practitioner to complete your personal care plan.  Depending on the condition, your plan could have included both over the counter or prescription medications.  If there is a problem please reply  once you have received a response from your provider.  Your safety is important to [place].  If you have drug allergies check your prescription carefully.    You can use MyChart to ask questions about today's visit, request a non-urgent call back, or ask for a work or school excuse for 24  hours related to this e-Visit. If it has been greater than 24 hours you will need to follow up with your provider, or enter a new e-Visit to address those concerns.  You will get an e-mail in the next two days asking about your experience.  I hope that your e-visit has been valuable and will speed your recovery. Thank you for using e-visits.  Greater than 5 minutes, yet less than 10 minutes of time have been spent researching, coordinating and implementing care for this patient today.

## 2019-11-11 ENCOUNTER — Ambulatory Visit: Payer: 59 | Admitting: Nurse Practitioner

## 2019-11-11 ENCOUNTER — Other Ambulatory Visit: Payer: Self-pay

## 2019-11-11 ENCOUNTER — Ambulatory Visit (INDEPENDENT_AMBULATORY_CARE_PROVIDER_SITE_OTHER): Payer: 59

## 2019-11-11 ENCOUNTER — Encounter: Payer: Self-pay | Admitting: Nurse Practitioner

## 2019-11-11 VITALS — BP 122/80 | HR 85 | Temp 97.8°F | Ht 65.0 in | Wt 214.0 lb

## 2019-11-11 DIAGNOSIS — M542 Cervicalgia: Secondary | ICD-10-CM

## 2019-11-11 DIAGNOSIS — Z6835 Body mass index (BMI) 35.0-35.9, adult: Secondary | ICD-10-CM

## 2019-11-11 DIAGNOSIS — M25512 Pain in left shoulder: Secondary | ICD-10-CM

## 2019-11-11 DIAGNOSIS — M549 Dorsalgia, unspecified: Secondary | ICD-10-CM

## 2019-11-11 DIAGNOSIS — I10 Essential (primary) hypertension: Secondary | ICD-10-CM

## 2019-11-11 DIAGNOSIS — Z6834 Body mass index (BMI) 34.0-34.9, adult: Secondary | ICD-10-CM

## 2019-11-11 DIAGNOSIS — E6609 Other obesity due to excess calories: Secondary | ICD-10-CM | POA: Diagnosis not present

## 2019-11-11 DIAGNOSIS — G8929 Other chronic pain: Secondary | ICD-10-CM

## 2019-11-11 DIAGNOSIS — M9979 Connective tissue and disc stenosis of intervertebral foramina of abdomen and other regions: Secondary | ICD-10-CM | POA: Insufficient documentation

## 2019-11-11 DIAGNOSIS — R7989 Other specified abnormal findings of blood chemistry: Secondary | ICD-10-CM

## 2019-11-11 DIAGNOSIS — M67912 Unspecified disorder of synovium and tendon, left shoulder: Secondary | ICD-10-CM | POA: Insufficient documentation

## 2019-11-11 MED ORDER — MELOXICAM 7.5 MG PO TABS: 7.5000 mg | ORAL_TABLET | Freq: Every day | ORAL | 0 refills | Status: AC

## 2019-11-11 MED ORDER — METHOCARBAMOL 750 MG PO TABS: 750.0000 mg | ORAL_TABLET | Freq: Four times a day (QID) | ORAL | 1 refills | Status: DC | PRN

## 2019-11-11 NOTE — Patient Instructions (Addendum)
Please go to the lab today.  I have ordered x-rays of your left shoulder and neck.  I placed referral into Dr. Martha Clan at [facility].  I have refilled your muscle relaxer, and will order meloxicam 7.5 mg daily for up  to 14  days to pick up after we have looked at your labs.  Please f/up with Dr. Darrick Huntsman   Shoulder Pain Many things can cause shoulder pain, including:  An injury to the shoulder.  Overuse of the shoulder.  Arthritis. The source of the pain can be:  Inflammation.  An injury to the shoulder joint.  An injury to a tendon, ligament, or bone. Follow these instructions at home: Pay attention to changes in your symptoms. Let your health care provider know about them. Follow these instructions to relieve your pain. If you have a sling:  Wear the sling as told by your health care provider. Remove it only as told by your health care provider.  Loosen the sling if your fingers tingle, become numb, or turn cold and blue.  Keep the sling clean.  If the sling is not waterproof: ? Do not let it get wet. Remove it to shower or bathe.  Move your arm as little as possible, but keep your hand moving to prevent swelling. Managing pain, stiffness, and swelling   If directed, put ice on the painful area: ? Put ice in a plastic bag. ? Place a towel between your skin and the bag. ? Leave the ice on for 20 minutes, 2-3 times per day. Stop applying ice if it does not help with the pain.  Squeeze a soft ball or a foam pad as much as possible. This helps to keep the shoulder from swelling. It also helps to strengthen the arm. General instructions  Take over-the-counter and prescription medicines only as told by your health care provider.  Keep all follow-up visits as told by your health care provider. This is important. Contact a health care provider if:  Your pain gets worse.  Your pain is not relieved with medicines.  New pain develops in your arm, hand, or  fingers. Get help right away if:  Your arm, hand, or fingers: ? Tingle. ? Become numb. ? Become swollen. ? Become painful. ? Turn white or blue. Summary  Shoulder pain can be caused by an injury, overuse, or arthritis.  Pay attention to changes in your symptoms. Let your health care provider know about them.  This condition may be treated with a sling, ice, and pain medicines.  Contact your health care provider if the pain gets worse or new pain develops. Get help right away if your arm, hand, or fingers tingle or become numb, swollen, or painful.  Keep all follow-up visits as told by your health care provider. This is important. This information is not intended to replace advice given to you by your health care provider. Make sure you discuss any questions you have with your health care provider. Document Revised: 07/08/2017 Document Reviewed: 07/08/2017 Elsevier Patient Education  2020 [employer].
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
## 2019-11-12 ENCOUNTER — Encounter: Payer: Self-pay | Admitting: Nurse Practitioner

## 2019-11-12 LAB — LIPID PANEL
Cholesterol: 140 mg/dL (ref 0–200)
HDL: 46.3 mg/dL (ref >39.00)
LDL Cholesterol: 73 mg/dL (ref 0–99)
NonHDL: 93.6
Total CHOL/HDL Ratio: 3
Triglycerides: 101 mg/dL (ref 0.0–149.0)
VLDL: 20.2 mg/dL (ref 0.0–40.0)

## 2019-11-12 LAB — CBC WITH DIFFERENTIAL/PLATELET
Basophils Absolute: 0.1 10*3/uL (ref 0.0–0.1)
Basophils Relative: 1.5 % (ref 0.0–3.0)
Eosinophils Absolute: 0.2 10*3/uL (ref 0.0–0.7)
Eosinophils Relative: 2.1 % (ref 0.0–5.0)
HCT: 37.6 % (ref 36.0–46.0)
Hemoglobin: 13.2 g/dL (ref 12.0–15.0)
Lymphocytes Relative: 12.9 % (ref 12.0–46.0)
Lymphs Abs: 1.1 10*3/uL (ref 0.7–4.0)
MCHC: 35.2 g/dL (ref 30.0–36.0)
MCV: 88.2 fl (ref 78.0–100.0)
Monocytes Absolute: 0.4 10*3/uL (ref 0.1–1.0)
Monocytes Relative: 4.7 % (ref 3.0–12.0)
Neutro Abs: 6.7 10*3/uL (ref 1.4–7.7)
Neutrophils Relative %: 78.8 % — ABNORMAL HIGH (ref 43.0–77.0)
Platelets: 249 10*3/uL (ref 150.0–400.0)
RBC: 4.27 Mil/uL (ref 3.87–5.11)
RDW: 14.9 % (ref 11.5–15.5)
WBC: 8.5 10*3/uL (ref 4.0–10.5)

## 2019-11-12 LAB — COMPREHENSIVE METABOLIC PANEL
ALT: 13 U/L (ref 0–35)
AST: 13 U/L (ref 0–37)
Albumin: 4.3 g/dL (ref 3.5–5.2)
Alkaline Phosphatase: 58 U/L (ref 39–117)
BUN: 12 mg/dL (ref 6–23)
CO2: 32 mEq/L (ref 19–32)
Calcium: 8.7 mg/dL (ref 8.4–10.5)
Chloride: 100 mEq/L (ref 96–112)
Creatinine, Ser: 0.69 mg/dL (ref 0.40–1.20)
GFR: 105.8 mL/min (ref >60.00)
Glucose, Bld: 82 mg/dL (ref 70–99)
Potassium: 3.7 mEq/L (ref 3.5–5.1)
Sodium: 139 mEq/L (ref 135–145)
Total Bilirubin: 0.7 mg/dL (ref 0.2–1.2)
Total Protein: 6.2 g/dL (ref 6.0–8.3)

## 2019-11-12 LAB — TSH: TSH: 5.79 u[IU]/mL — ABNORMAL HIGH (ref 0.35–4.50)

## 2019-11-12 LAB — HEMOGLOBIN A1C: Hgb A1c MFr Bld: 4.2 % — ABNORMAL LOW (ref 4.6–6.5)

## 2019-11-14 ENCOUNTER — Encounter: Payer: Self-pay | Admitting: Nurse Practitioner

## 2019-11-15 ENCOUNTER — Telehealth: Payer: Self-pay | Admitting: Internal Medicine

## 2019-11-15 NOTE — Telephone Encounter (Signed)
CD burned & placed up front for pickup. Left voicemail on mobile number to notify patient.

## 2019-11-15 NOTE — Telephone Encounter (Signed)
Patient is requesting a copy of her xrays from 11/11/19 to take to her Thursday appt  with Ortho.

## 2019-11-18 ENCOUNTER — Other Ambulatory Visit (INDEPENDENT_AMBULATORY_CARE_PROVIDER_SITE_OTHER): Payer: 59

## 2019-11-18 ENCOUNTER — Other Ambulatory Visit: Payer: Self-pay

## 2019-11-18 DIAGNOSIS — R7989 Other specified abnormal findings of blood chemistry: Secondary | ICD-10-CM | POA: Diagnosis not present

## 2019-11-19 LAB — TSH+FREE T4: TSH W/REFLEX TO FT4: 8.71 mIU/L — ABNORMAL HIGH

## 2019-11-19 LAB — T4, FREE: Free T4: 1.2 ng/dL (ref 0.8–1.8)

## 2019-11-26 LAB — HM MAMMOGRAPHY

## 2019-12-12 ENCOUNTER — Ambulatory Visit
Admission: EM | Admit: 2019-12-12 | Discharge: 2019-12-12 | Disposition: A | Discharge status: Home / Self Care | Payer: 59

## 2019-12-12 ENCOUNTER — Other Ambulatory Visit: Payer: Self-pay

## 2019-12-12 DIAGNOSIS — M542 Cervicalgia: Secondary | ICD-10-CM | POA: Diagnosis not present

## 2019-12-12 DIAGNOSIS — M25512 Pain in left shoulder: Secondary | ICD-10-CM | POA: Diagnosis not present
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
## 2019-12-12 NOTE — Discharge Instructions (Addendum)
Follow-up with spinal surgery as scheduled.  Follow Dr. Cecilie Lowers advice and take the increase in medications.  Change your muscle doctor to Zanaflex.  This is a more potent muscle relaxer and may make you drowsy but should also for the spasm.  Looking to get a massage to help alleviate some of the spasm which is adding to your pain.  If your pain is not relieved by the medications or massage I would seek treatment in the ER.
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
## 2019-12-13 ENCOUNTER — Ambulatory Visit: Payer: 59 | Admitting: Internal Medicine

## 2019-12-13 ENCOUNTER — Encounter: Payer: Self-pay | Admitting: Internal Medicine

## 2019-12-13 VITALS — BP 146/84 | HR 75 | Temp 97.5°F | Resp 16 | Ht 65.0 in | Wt 214.4 lb

## 2019-12-13 DIAGNOSIS — I10 Essential (primary) hypertension: Secondary | ICD-10-CM

## 2019-12-13 DIAGNOSIS — M9979 Connective tissue and disc stenosis of intervertebral foramina of abdomen and other regions: Secondary | ICD-10-CM | POA: Diagnosis not present

## 2019-12-13 DIAGNOSIS — E032 Hypothyroidism due to medicaments and other exogenous substances: Secondary | ICD-10-CM

## 2019-12-13 DIAGNOSIS — M519 Unspecified thoracic, thoracolumbar and lumbosacral intervertebral disc disorder: Secondary | ICD-10-CM

## 2019-12-13 DIAGNOSIS — N951 Menopausal and female climacteric states: Secondary | ICD-10-CM | POA: Diagnosis not present

## 2019-12-13 MED ORDER — HYDROCODONE-ACETAMINOPHEN 10-325 MG PO TABS
1.0000 | ORAL_TABLET | Freq: Four times a day (QID) | ORAL | 0 refills | Status: DC | PRN
Start: 2019-12-13 — End: 2019-12-20

## 2019-12-13 MED ORDER — HYDROCODONE-ACETAMINOPHEN 10-325 MG PO TABS
1.0000 | ORAL_TABLET | Freq: Four times a day (QID) | ORAL | 0 refills | Status: DC | PRN
Start: 2019-12-13 — End: 2019-12-13

## 2019-12-13 MED ORDER — SYNTHROID 300 MCG PO TABS: 300.0000 ug | ORAL_TABLET | Freq: Every day | ORAL | 0 refills | Status: DC

## 2019-12-13 NOTE — Patient Instructions (Addendum)
Adding Vicodin for pain control  Stop the motrin. Continue prednisone, gabapentin   Add 1000 mg tylenol twice daily   Get a soft cervical collar or an airplane pillow and wear it to support neck  When sitting up   Out of work this week   Name brand synthroid for 6 weeks.  Recheck tsh in 6 week
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
## 2019-12-14 NOTE — Assessment & Plan Note (Signed)
Elevated today due to acute  Pain.  No changes to regimen today,  RTC one mo nth

## 2019-12-14 NOTE — Assessment & Plan Note (Signed)
Changing LT4 therapy to NBO .  Repeat TSH in 6 weeks and increase dose for TSH>4

## 2019-12-14 NOTE — Assessment & Plan Note (Addendum)
left foraminal nerve root impingement at C6-7 level suggested by MRI>  Her pain is not controlled with non opioids and steroids.  Adding hydrocodone 10/325 qty #30 given,  Has neurosurgery follow up on Friday to review MI and plan appropriate therapy

## 2019-12-20 MED ORDER — HYDROCODONE-ACETAMINOPHEN 10-325 MG PO TABS: 1.0000 | ORAL_TABLET | Freq: Four times a day (QID) | ORAL | 0 refills | Status: DC | PRN

## 2019-12-28 ENCOUNTER — Other Ambulatory Visit: Payer: Self-pay | Admitting: Internal Medicine

## 2019-12-28 MED ORDER — HYDROCODONE-ACETAMINOPHEN 10-325 MG PO TABS: 1.0000 | ORAL_TABLET | Freq: Four times a day (QID) | ORAL | 0 refills | Status: DC | PRN

## 2020-01-01 ENCOUNTER — Other Ambulatory Visit: Payer: Self-pay | Admitting: Internal Medicine

## 2020-01-01 MED ORDER — PREGABALIN 100 MG PO CAPS: 200.0000 mg | ORAL_CAPSULE | Freq: Three times a day (TID) | ORAL | 0 refills | Status: DC

## 2020-01-04 ENCOUNTER — Other Ambulatory Visit: Payer: Self-pay | Admitting: Internal Medicine

## 2020-01-04 DIAGNOSIS — I10 Essential (primary) hypertension: Secondary | ICD-10-CM

## 2020-01-13 HISTORY — PX: OTHER SURGICAL HISTORY: SHX169

## 2020-02-23 ENCOUNTER — Other Ambulatory Visit: Payer: Self-pay | Admitting: Internal Medicine

## 2020-11-14 ENCOUNTER — Other Ambulatory Visit: Payer: Self-pay

## 2020-11-14 ENCOUNTER — Ambulatory Visit: Payer: 59 | Admitting: Family Medicine

## 2020-11-14 ENCOUNTER — Encounter: Payer: Self-pay | Admitting: Family Medicine

## 2020-11-14 VITALS — BP 120/80 | HR 92 | Temp 98.6°F | Ht 65.0 in | Wt 217.8 lb

## 2020-11-14 DIAGNOSIS — J101 Influenza due to other identified influenza virus with other respiratory manifestations: Secondary | ICD-10-CM | POA: Insufficient documentation

## 2020-11-14 DIAGNOSIS — R6889 Other general symptoms and signs: Secondary | ICD-10-CM | POA: Diagnosis not present

## 2020-11-14 LAB — POCT INFLUENZA A/B
Influenza A, POC: POSITIVE — AB
Influenza B, POC: NEGATIVE

## 2020-11-14 MED ORDER — OSELTAMIVIR PHOSPHATE 75 MG PO CAPS: 75.0000 mg | ORAL_CAPSULE | Freq: Two times a day (BID) | ORAL | 0 refills | Status: DC

## 2020-11-14 MED ORDER — HYDROCOD POLST-CPM POLST ER 10-8 MG/5ML PO SUER: 5.0000 mL | Freq: Two times a day (BID) | ORAL | 0 refills | Status: DC | PRN

## 2020-11-14 NOTE — Progress Notes (Signed)
[name] °

## 2020-11-14 NOTE — Assessment & Plan Note (Signed)
The patient had a positive flu test.  We will treat with Tamiflu.  Tussionex was prescribed for cough.  Controlled substance database reviewed.  Advised to monitor for drowsiness with the Tussionex.  She was given a work note to be out this week and return to work on 11/20/2020.  She was advised to seek medical attention for shortness of breath, cough productive of blood, or worsening symptoms.

## 2020-11-14 NOTE — Progress Notes (Signed)
Marikay Alar, MD Phone: [phone]  [name] is a 45 y.o. female who presents today for same day visit.   Cough: Patient notes onset of symptoms on 11/11/2020.  Started with achiness and congestion.  She then developed significant cough that is nonproductive.  She has had congestion.  She notes no fevers though has felt clammy.  Some postnasal drip.  No sore throat.  Possibly minimal shortness of breath when coughing.  No taste or smell disturbances.  No COVID exposures.  She does note her daughter had the flu last week.  The patient has had 2 negative home COVID test and had a negative PCR test yesterday.  She has been taking DayQuil and NyQuil with little benefit.  Her cough is more bothersome at night.  Social History   Tobacco Use  Smoking Status Never  Smokeless Tobacco Never    Current Outpatient Medications on File Prior to Visit  Medication Sig Dispense Refill   fexofenadine (ALLEGRA) 180 MG tablet Take 180 mg by mouth daily.     fluticasone (FLONASE) 50 MCG/ACT nasal spray Place 2 sprays into both nostrils daily. 16 g 2   HYDROcodone-acetaminophen (NORCO) 10-325 MG tablet Take 1 tablet by mouth every 6 (six) hours as needed. 120 tablet 0   ibuprofen (ADVIL,MOTRIN) 600 MG tablet Take 1 tablet (600 mg total) every 6 (six) hours as needed by mouth. (Patient taking differently: Take 800 mg by mouth every 6 (six) hours as needed.) 30 tablet 0   levothyroxine (SYNTHROID) 300 MCG tablet TAKE 1 TABLET BY MOUTH  DAILY BEFORE BREAKFAST 90 tablet 3   methylPREDNISolone (MEDROL DOSEPAK) 4 MG TBPK tablet Take by mouth.     pregabalin (LYRICA) 100 MG capsule Take 2 capsules (200 mg total) by mouth 3 (three) times daily. 180 capsule 0   sertraline (ZOLOFT) 50 MG tablet TAKE 1 AND 1/2 TABLETS BY  MOUTH DAILY 135 tablet 3   tiZANidine (ZANAFLEX) 4 MG tablet Take by mouth.     triamterene-hydrochlorothiazide (MAXZIDE-25) 37.5-25 MG tablet TAKE 1 TABLET BY MOUTH  DAILY 90 tablet 3   No  current facility-administered medications on file prior to visit.     ROS see history of present illness  Objective  Physical Exam Vitals:   11/14/20 1020  BP: 120/80  Pulse: 92  Temp: 98.6 F (37 C)  SpO2: 99%    BP Readings from Last 3 Encounters:  11/14/20 120/80  12/13/19 (!) 146/84  12/12/19 125/84   Wt Readings from Last 3 Encounters:  11/14/20 217 lb 12.8 oz (98.8 kg)  12/13/19 214 lb 6.4 oz (97.3 kg)  12/12/19 214 lb (97.1 kg)    Physical Exam Constitutional:      General: She is not in acute distress.    Appearance: She is not diaphoretic.  HENT:     Right Ear: Tympanic membrane normal.     Left Ear: Tympanic membrane normal.     Mouth/Throat:     Mouth: Mucous membranes are moist.     Pharynx: Oropharynx is clear.  Cardiovascular:     Rate and Rhythm: Normal rate and regular rhythm.     Heart sounds: Normal heart sounds.  Pulmonary:     Effort: Pulmonary effort is normal.     Breath sounds: Normal breath sounds.  Lymphadenopathy:     Cervical: No cervical adenopathy.  Skin:    General: Skin is warm and dry.  Neurological:     Mental Status: She is alert.  Assessment/Plan: Please see individual problem list.  Problem List Items Addressed This Visit     Influenza A    The patient had a positive flu test.  We will treat with Tamiflu.  Tussionex was prescribed for cough.  Controlled substance database reviewed.  Advised to monitor for drowsiness with the Tussionex.  She was given a work note to be out this week and return to work on 11/20/2020.  She was advised to seek medical attention for shortness of breath, cough productive of blood, or worsening symptoms.      Relevant Medications   oseltamivir (TAMIFLU) 75 MG capsule   chlorpheniramine-HYDROcodone (TUSSIONEX PENNKINETIC ER) 10-8 MG/5ML SUER   Other Visit Diagnoses     Flu-like symptoms    -  Primary   Relevant Orders   POCT Influenza A/B (Completed)       Return if symptoms  worsen or fail to improve.  This visit occurred during the SARS-CoV-2 public health emergency.  Safety protocols were in place, including screening questions prior to the visit, additional usage of staff PPE, and extensive cleaning of exam room while observing appropriate contact time as indicated for disinfecting solutions.    Marikay Alar, MD [facility]

## 2020-11-14 NOTE — Patient Instructions (Addendum)
Nice to see you. You have influenza.  I am going to write you a work note. Please start the Tamiflu.  You can use the Tussionex for your cough.  If this makes you excessively drowsy please discontinue it and let us know. If you develop shortness of breath, cough productive of blood, or any worsening symptoms please be reevaluated.

## 2020-11-20 ENCOUNTER — Ambulatory Visit: Payer: 59 | Admitting: Internal Medicine

## 2020-11-20 ENCOUNTER — Encounter: Payer: Self-pay | Admitting: Internal Medicine

## 2020-11-20 ENCOUNTER — Other Ambulatory Visit: Payer: Self-pay

## 2020-11-20 VITALS — BP 114/70 | HR 91 | Temp 96.1°F | Ht 65.0 in | Wt 222.4 lb

## 2020-11-20 DIAGNOSIS — R5382 Chronic fatigue, unspecified: Secondary | ICD-10-CM | POA: Diagnosis not present

## 2020-11-20 DIAGNOSIS — M545 Low back pain, unspecified: Secondary | ICD-10-CM

## 2020-11-20 DIAGNOSIS — M519 Unspecified thoracic, thoracolumbar and lumbosacral intervertebral disc disorder: Secondary | ICD-10-CM

## 2020-11-20 DIAGNOSIS — E032 Hypothyroidism due to medicaments and other exogenous substances: Secondary | ICD-10-CM

## 2020-11-20 DIAGNOSIS — Z6831 Body mass index (BMI) 31.0-31.9, adult: Secondary | ICD-10-CM

## 2020-11-20 DIAGNOSIS — N912 Amenorrhea, unspecified: Secondary | ICD-10-CM | POA: Diagnosis not present

## 2020-11-20 DIAGNOSIS — I10 Essential (primary) hypertension: Secondary | ICD-10-CM | POA: Diagnosis not present

## 2020-11-20 DIAGNOSIS — E6609 Other obesity due to excess calories: Secondary | ICD-10-CM | POA: Diagnosis not present

## 2020-11-20 DIAGNOSIS — E66811 Obesity, class 1: Secondary | ICD-10-CM

## 2020-11-20 DIAGNOSIS — G8929 Other chronic pain: Secondary | ICD-10-CM

## 2020-11-20 DIAGNOSIS — M9979 Connective tissue and disc stenosis of intervertebral foramina of abdomen and other regions: Secondary | ICD-10-CM

## 2020-11-20 MED ORDER — TIRZEPATIDE 2.5 MG/0.5ML ~~LOC~~ SOAJ: 2.5000 mg | SUBCUTANEOUS | 2 refills | Status: DC

## 2020-11-20 NOTE — Progress Notes (Signed)
Subjective:  Patient ID: [name], female    DOB: 19-Sep-1975  Age: 45 y.o. MRN: [id number]  CC: The primary encounter diagnosis was Iatrogenic hypothyroidism. 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  HPI [name] presents for follow up on multiple issues   This visit occurred during the SARS-CoV-2 public health emergency.  Safety protocols were in place, including screening questions prior to the visit, additional usage of staff PPE, and extensive cleaning of exam room while observing appropriate contact time as indicated for disinfecting solutions.   1) [name] tested positive for Influenza A on Nov 8 , symptoms started on Nov 5;   was treated with tamiflu, tussionex and has recovered without complications.  She continues to have a mild cough without sputum, pleurisy or wheezing.   2) Hypertension: patient checks blood pressure twice weekly at home.  Readings have been for the most part < 140/80 at rest . Patient is following a reduce salt diet most days and is taking medications as prescribed   3) Hypothyroidism :  she has been  taking 300 mg daily since the summer.  Thyroid was underactive at that time but no dose change was made. Previously her hypothyroidism was managed by [facility].  Notes and previous supplementation doses reviewed with patient today.  She is taking the medication appropriately.    4) Obesity:  frustrated at her inability to lose weight.  She has gained 4 lbs since last visit.  BMI now 36 .  Hungry all the time .  Using My Fitness Pal to chart progress.  .  1300 cal restriction,  walking 3 days per week for 30 minutes . No other  exercise. Works full time.   5) s/p ACDF with 2 level  fusion jan 2022  with   good results   6) Amenorrhea : s/p uterine ablation in 2016 at [facility] for menorrhagia.  Currently only  spots occasionally for 1/2 day. Reviewed the possibility of entering menopause given her mother's menopause age of 45   Outpatient Medications Prior to Visit  Medication Sig Dispense Refill   fluticasone (FLONASE) 50 MCG/ACT nasal spray Place 2 sprays into both nostrils daily. 16 g 2   levothyroxine (SYNTHROID) 300 MCG tablet TAKE 1 TABLET BY MOUTH  DAILY BEFORE BREAKFAST 90 tablet 3   sertraline (ZOLOFT) 50 MG tablet TAKE 1 AND 1/2 TABLETS BY  MOUTH DAILY 135 tablet 3   tiZANidine (ZANAFLEX) 4 MG tablet Take by mouth.     triamterene-hydrochlorothiazide (MAXZIDE-25) 37.5-25 MG tablet TAKE 1 TABLET BY MOUTH  DAILY 90 tablet 3   ibuprofen (ADVIL,MOTRIN) 600 MG tablet Take 1 tablet (600 mg total) every 6 (six) hours as needed by mouth. (Patient not taking: Reported on 11/20/2020) 30 tablet 0   pregabalin (LYRICA) 100 MG capsule Take 2 capsules (200 mg total) by mouth 3 (three) times daily. (Patient not taking: Reported on 11/20/2020) 180 capsule 0   chlorpheniramine-HYDROcodone (TUSSIONEX PENNKINETIC ER) 10-8 MG/5ML SUER Take 5 mLs by mouth every 12 (twelve) hours as needed for cough. (Patient not taking: Reported on 11/20/2020) 115 mL 0   fexofenadine (ALLEGRA) 180 MG tablet Take 180 mg by mouth daily. (Patient not taking: Reported  on 11/20/2020)     HYDROcodone-acetaminophen (NORCO) 10-325 MG tablet Take 1 tablet by mouth every 6 (six) hours as needed. (Patient not taking: Reported on 11/20/2020) 120 tablet 0   methylPREDNISolone (MEDROL DOSEPAK) 4 MG TBPK tablet Take by mouth. (Patient not taking: Reported on 11/20/2020)     oseltamivir (TAMIFLU) 75 MG capsule Take 1 capsule (75 mg total) by mouth 2 (two) times daily. (Patient not taking: Reported on 11/20/2020) 10 capsule 0   No facility-administered  medications prior to visit.    Review of Systems;  Patient denies headache, fevers, malaise, unintentional weight loss, skin rash, eye pain, sinus congestion and sinus pain, sore throat, dysphagia,  hemoptysis , cough, dyspnea, wheezing, chest pain, palpitations, orthopnea, edema, abdominal pain, nausea, melena, diarrhea, constipation, flank pain, dysuria, hematuria, urinary  Frequency, nocturia, numbness, tingling, seizures,  Focal weakness, Loss of consciousness,  Tremor, insomnia, depression, anxiety, and suicidal ideation.      Objective:  BP 114/70 (BP Location: Left Arm, Patient Position: Sitting, Cuff Size: Large)   Pulse 91   Temp (!) 96.1 F (35.6 C) (Temporal)   Ht 5\' 5"  (1.651 m)   Wt 222 lb 6.4 oz (100.9 kg)   SpO2 99%   BMI 37.01 kg/m   BP Readings from Last 3 Encounters:  11/20/20 114/70  11/14/20 120/80  12/13/19 (!) 146/84    Wt Readings from Last 3 Encounters:  11/20/20 222 lb 6.4 oz (100.9 kg)  11/14/20 217 lb 12.8 oz (98.8 kg)  12/13/19 214 lb 6.4 oz (97.3 kg)    General appearance: alert, cooperative and appears stated age Ears: normal TM's and external ear canals both ears Throat: lips, mucosa, and tongue normal; teeth and gums normal Neck: no adenopathy, no carotid bruit, supple, symmetrical, trachea midline and thyroid not enlarged, symmetric, no tenderness/mass/nodules Back: symmetric, no curvature. ROM normal. No CVA tenderness. Lungs: clear to auscultation bilaterally Heart: regular rate and rhythm, S1, S2 normal, no murmur, click, rub or gallop Abdomen: soft, non-tender; bowel sounds normal; no masses,  no organomegaly Pulses: 2+ and symmetric Skin: Skin color, texture, turgor normal. No rashes or lesions Lymph nodes: Cervical, supraclavicular, and axillary nodes normal.  Lab Results  Component Value Date   HGBA1C 4.2 (L) 11/11/2019   HGBA1C 4.1 03/12/2019    Lab Results  Component Value Date   CREATININE 0.69 11/11/2019   CREATININE  0.77 03/12/2019   CREATININE 0.73 11/04/2018    Lab Results  Component Value Date   WBC 8.5 11/11/2019   HGB 13.2 11/11/2019   HCT 37.6 11/11/2019   PLT 249.0 11/11/2019   GLUCOSE 82 11/11/2019   CHOL 140 11/11/2019   TRIG 101.0 11/11/2019   HDL 46.30 11/11/2019   LDLDIRECT 95 03/12/2019   LDLCALC 73 11/11/2019   ALT 13 11/11/2019   AST 13 11/11/2019   NA 139 11/11/2019   K 3.7 11/11/2019   CL 100 11/11/2019   CREATININE 0.69 11/11/2019   BUN 12 11/11/2019   CO2 32 11/11/2019   TSH 2.01 11/20/2020   HGBA1C 4.2 (L) 11/11/2019   MICROALBUR 0.2 10/02/2012    No results found.  Assessment & Plan:   Problem List Items Addressed This Visit     Iatrogenic hypothyroidism - Primary    Dose has been 300 mcg or higher for several years.  (reviewed [facility] Endocrinology records from 2018, last visit) but is currently normal range.  No changes to dosing today   Lab Results  Component Value Date  TSH 2.01 11/20/2020         Relevant Orders   Thyroid Panel With TSH (Completed)   Essential hypertension    Well controlled on current regimen of Maxzide only. . Renal functiondue, no changes today.      Relevant Orders   Comprehensive metabolic panel   Obesity    Patient has been unable to lose or maintain a healthy weight despite good effort. Encouraged to increase exercise involvement to include more intense aerobic activity for 30 minutes 5 days per week.  Screened for contraindications to use of  GLP 1 agonists for appetite suppression and she has none.  The risks and benefits of pharmacotherapy discussed and she is requesting a trial of therapy .  rx written.       Relevant Medications   tirzepatide [facility]) 2.5 MG/0.5ML Pen   Other Relevant Orders   Hemoglobin A1c   Chronic back pain    per notes from [facility] referencing  A lumbar MRI done at [facility] that is not accessible, she has  central disk herniation and Grade  retrolisthesis at L5-S1 as well as mild to moderate  foraminal stenosis bilaterally at L4-L5 .       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   She continues to use tizanidine prn shoulder pain /muscle spasm      Other Visit Diagnoses     Chronic fatigue       Relevant Orders   CBC with Differential/Platelet   Amenorrhea       Relevant Orders   Follicle stimulating hormone   LH       I spent 40 minutes dedicated to the care of this patient on the date of this encounter to include pre-visit review of her medical history,  most recent imaging studies, Face-to-face time with the patient , and post visit ordering of testing and therapeutics.   Meds ordered this encounter  Medications   tirzepatide (MOUNJARO) 2.5 MG/0.5ML Pen    Sig: Inject 2.5 mg into the skin once a week.    Dispense:  2 mL    Refill:  2    Medications Discontinued During This Encounter  Medication Reason   chlorpheniramine-HYDROcodone (TUSSIONEX PENNKINETIC ER) 10-8 MG/5ML SUER    fexofenadine (ALLEGRA) 180 MG tablet    HYDROcodone-acetaminophen (NORCO) 10-325 MG tablet    methylPREDNISolone (MEDROL DOSEPAK) 4 MG TBPK tablet    oseltamivir (TAMIFLU) 75 MG capsule     Follow-up: No follow-ups on file.   Sherlene Shams, MD

## 2020-11-20 NOTE — Patient Instructions (Addendum)
   I am prescribing a medication to help you lose weight called Mounjaro.  You can read about it and obtain the $25 copay card on their website: Mounjaro.com  (THEY WILL ASK YOU IF YOU HAVE DIABETES IN ORDER TO DOWNLOAD AND RECEIVE  THE COUPON)  Mounjaro is a GLP-1 receptor agonist.  It is a medication that is taken as a weekly subcutaneous injection. It is NOT insulin.  It  causes your pancreas to increase its  own insulin secretion  And also slows down the emptying of your stomach,  So it decreases your appetite and helps you lose weight. For people with diabetes,  the decreased appetite results in lower blood sugars.   The dose for the first 4 weekly doses is 2.5 mg .  You may have mild nausea on the first or second day but this should resolve.  If not  ,  stop the medication.   As long as you are losing weight,  you can continue the dose you are on .   FOR YOUR REFILLS,  SEND ME A MYCHART MESSAGE WITH THE FOLLOWING INFORMATION:  1)  the dose you are currently taking (2.5 mg for example)  2) whether or not you have lost weight in the preceding week (has your weight plateaued?)  I recommend that you  increase the dose every  4 weeks ONLY if your weight has plateaued.    The pharmacies that will honor the coupon are [facility], [facility]  and [facility]   

## 2020-11-21 ENCOUNTER — Other Ambulatory Visit: Payer: 59

## 2020-11-21 ENCOUNTER — Encounter: Payer: Self-pay | Admitting: Internal Medicine

## 2020-11-21 DIAGNOSIS — R7309 Other abnormal glucose: Secondary | ICD-10-CM

## 2020-11-21 LAB — THYROID PANEL WITH TSH
Free Thyroxine Index: 2.8 (ref 1.4–3.8)
T3 Uptake: 28 % (ref 22–35)
T4, Total: 10 ug/dL (ref 5.1–11.9)
TSH: 2.01 mIU/L

## 2020-11-21 LAB — COMPREHENSIVE METABOLIC PANEL
ALT: 8 U/L (ref 0–35)
AST: 10 U/L (ref 0–37)
Albumin: 4.5 g/dL (ref 3.5–5.2)
Alkaline Phosphatase: 49 U/L (ref 39–117)
BUN: 10 mg/dL (ref 6–23)
CO2: 30 mEq/L (ref 19–32)
Calcium: 9.1 mg/dL (ref 8.4–10.5)
Chloride: 100 mEq/L (ref 96–112)
Creatinine, Ser: 0.76 mg/dL (ref 0.40–1.20)
GFR: 94.84 mL/min (ref >60.00)
Glucose, Bld: 93 mg/dL (ref 70–99)
Potassium: 3.5 mEq/L (ref 3.5–5.1)
Sodium: 138 mEq/L (ref 135–145)
Total Bilirubin: 0.9 mg/dL (ref 0.2–1.2)
Total Protein: 6.9 g/dL (ref 6.0–8.3)

## 2020-11-21 LAB — CBC WITH DIFFERENTIAL/PLATELET
Basophils Absolute: 0.1 10*3/uL (ref 0.0–0.1)
Basophils Relative: 1 % (ref 0.0–3.0)
Eosinophils Absolute: 0.2 10*3/uL (ref 0.0–0.7)
Eosinophils Relative: 3 % (ref 0.0–5.0)
HCT: 36.3 % (ref 36.0–46.0)
Hemoglobin: 12.7 g/dL (ref 12.0–15.0)
Lymphocytes Relative: 14.4 % (ref 12.0–46.0)
Lymphs Abs: 0.8 10*3/uL (ref 0.7–4.0)
MCHC: 34.9 g/dL (ref 30.0–36.0)
MCV: 84.6 fl (ref 78.0–100.0)
Monocytes Absolute: 0.2 10*3/uL (ref 0.1–1.0)
Monocytes Relative: 2.6 % — ABNORMAL LOW (ref 3.0–12.0)
Neutro Abs: 4.6 10*3/uL (ref 1.4–7.7)
Neutrophils Relative %: 79 % — ABNORMAL HIGH (ref 43.0–77.0)
Platelets: 278 10*3/uL (ref 150.0–400.0)
RBC: 4.29 Mil/uL (ref 3.87–5.11)
RDW: 15 % (ref 11.5–15.5)
WBC: 5.9 10*3/uL (ref 4.0–10.5)

## 2020-11-21 LAB — FOLLICLE STIMULATING HORMONE: FSH: 8.9 m[IU]/mL

## 2020-11-21 LAB — LUTEINIZING HORMONE: LH: 9.15 m[IU]/mL

## 2020-11-21 NOTE — Assessment & Plan Note (Addendum)
per notes from [facility] referencing  A lumbar MRI done at [facility] that is not accessible, she has  central disk herniation and Grade  retrolisthesis at L5-S1 as well as mild to moderate foraminal stenosis bilaterally at L4-L5 .

## 2020-11-21 NOTE — Assessment & Plan Note (Signed)
Well controlled on current regimen of Maxzide only. . Renal functiondue, no changes today.

## 2020-11-21 NOTE — Assessment & Plan Note (Addendum)
S/p ACDF with 2 level fusion Jan 2022 for relief of persistent radiculopathic pain.   She continues to use tizanidine prn shoulder pain /muscle spasm

## 2020-11-21 NOTE — Assessment & Plan Note (Signed)
Dose has been 300 mcg or higher for several years.  (reviewed [facility] Endocrinology records from 2018, last visit) but is currently normal range.  No changes to dosing today   Lab Results  Component Value Date   TSH 2.01 11/20/2020

## 2020-11-21 NOTE — Assessment & Plan Note (Signed)
Patient has been unable to lose or maintain a healthy weight despite good effort. Encouraged to increase exercise involvement to include more intense aerobic activity for 30 minutes 5 days per week.  Screened for contraindications to use of  GLP 1 agonists for appetite suppression and she has none.  The risks and benefits of pharmacotherapy discussed and she is requesting a trial of therapy .  rx written.  °

## 2020-11-22 LAB — HEMOGLOBIN A1C
Hgb A1c MFr Bld: 4.4 % of total Hgb (ref <5.7)
Mean Plasma Glucose: 80 mg/dL
eAG (mmol/L): 4.4 mmol/L

## 2020-11-22 MED ORDER — TIRZEPATIDE 2.5 MG/0.5ML ~~LOC~~ SOAJ: 2.5000 mg | SUBCUTANEOUS | 2 refills | Status: DC

## 2020-12-18 ENCOUNTER — Encounter: Payer: Self-pay | Admitting: Internal Medicine

## 2020-12-18 MED ORDER — TIRZEPATIDE 2.5 MG/0.5ML ~~LOC~~ SOAJ: 2.5000 mg | SUBCUTANEOUS | 2 refills | Status: DC

## 2020-12-18 NOTE — Addendum Note (Signed)
Addended by: Sandy Salaam on: 12/18/2020 04:59 PM   Modules accepted: Orders

## 2020-12-19 ENCOUNTER — Encounter: Payer: Self-pay | Admitting: Internal Medicine

## 2020-12-19 DIAGNOSIS — I10 Essential (primary) hypertension: Secondary | ICD-10-CM

## 2020-12-19 MED ORDER — SERTRALINE HCL 50 MG PO TABS: 75.0000 mg | ORAL_TABLET | Freq: Every day | ORAL | 3 refills | Status: DC

## 2020-12-19 MED ORDER — TIRZEPATIDE 2.5 MG/0.5ML ~~LOC~~ SOAJ: 2.5000 mg | SUBCUTANEOUS | 2 refills | Status: DC

## 2020-12-19 MED ORDER — TRIAMTERENE-HCTZ 37.5-25 MG PO TABS: 1.0000 | ORAL_TABLET | Freq: Every day | ORAL | 3 refills | Status: DC

## 2020-12-22 MED ORDER — CONTRAVE 8-90 MG PO TB12: ORAL_TABLET | ORAL | 0 refills | Status: DC

## 2020-12-22 NOTE — Addendum Note (Signed)
Addended by: Sherlene Shams on: 12/22/2020 02:58 PM   Modules accepted: Orders

## 2020-12-24 ENCOUNTER — Other Ambulatory Visit: Payer: Self-pay | Admitting: Internal Medicine

## 2020-12-24 MED ORDER — CONTRAVE 8-90 MG PO TB12: ORAL_TABLET | ORAL | 0 refills | Status: DC

## 2020-12-28 ENCOUNTER — Telehealth: Payer: Self-pay

## 2020-12-28 NOTE — Telephone Encounter (Signed)
PA for Contrave has been submitted on covermymeds.  

## 2021-01-10 ENCOUNTER — Other Ambulatory Visit: Payer: Self-pay | Admitting: Internal Medicine

## 2021-01-24 ENCOUNTER — Encounter: Payer: Self-pay | Admitting: Internal Medicine

## 2021-01-24 ENCOUNTER — Other Ambulatory Visit: Payer: Self-pay | Admitting: Family

## 2021-01-24 MED ORDER — CONTRAVE 8-90 MG PO TB12: ORAL_TABLET | ORAL | 0 refills | Status: DC

## 2021-01-26 ENCOUNTER — Other Ambulatory Visit: Payer: Self-pay

## 2021-01-26 MED ORDER — CONTRAVE 8-90 MG PO TB12: ORAL_TABLET | ORAL | 0 refills | Status: DC

## 2021-01-30 LAB — HM MAMMOGRAPHY

## 2021-02-27 ENCOUNTER — Other Ambulatory Visit: Payer: Self-pay

## 2021-02-27 MED ORDER — CONTRAVE 8-90 MG PO TB12: ORAL_TABLET | ORAL | 0 refills | Status: DC

## 2021-03-12 ENCOUNTER — Encounter: Payer: Self-pay | Admitting: Internal Medicine

## 2021-03-12 DIAGNOSIS — Z1159 Encounter for screening for other viral diseases: Secondary | ICD-10-CM

## 2021-03-12 DIAGNOSIS — Z23 Encounter for immunization: Secondary | ICD-10-CM

## 2021-03-19 ENCOUNTER — Ambulatory Visit (INDEPENDENT_AMBULATORY_CARE_PROVIDER_SITE_OTHER): Payer: 59 | Admitting: *Deleted

## 2021-03-19 ENCOUNTER — Other Ambulatory Visit: Payer: Self-pay

## 2021-03-19 DIAGNOSIS — Z1159 Encounter for screening for other viral diseases: Secondary | ICD-10-CM

## 2021-03-19 DIAGNOSIS — Z111 Encounter for screening for respiratory tuberculosis: Secondary | ICD-10-CM | POA: Diagnosis not present

## 2021-03-19 DIAGNOSIS — Z23 Encounter for immunization: Secondary | ICD-10-CM

## 2021-03-19 NOTE — Addendum Note (Signed)
Addended by: Warden Fillers on: 03/19/2021 04:24 PM ? ? Modules accepted: Orders ? ?

## 2021-03-20 LAB — MEASLES/MUMPS/RUBELLA IMMUNITY
Mumps IgG: 66.3 AU/mL
Rubella: 8.73 Index
Rubeola IgG: >300 AU/mL

## 2021-03-20 LAB — HEPATITIS B CORE ANTIBODY, TOTAL: Hep B Core Total Ab: NONREACTIVE

## 2021-03-20 LAB — VARICELLA ZOSTER ANTIBODY, IGG: Varicella IgG: 1840 index

## 2021-03-28 ENCOUNTER — Other Ambulatory Visit: Payer: Self-pay

## 2021-03-28 MED ORDER — CONTRAVE 8-90 MG PO TB12: ORAL_TABLET | ORAL | 0 refills | Status: DC

## 2021-04-26 ENCOUNTER — Other Ambulatory Visit: Payer: Self-pay

## 2021-04-26 MED ORDER — CONTRAVE 8-90 MG PO TB12
ORAL_TABLET | ORAL | 0 refills | Status: DC
Start: 2021-04-26 — End: 2021-05-25

## 2021-05-25 ENCOUNTER — Other Ambulatory Visit: Payer: Self-pay

## 2021-05-25 MED ORDER — CONTRAVE 8-90 MG PO TB12
ORAL_TABLET | ORAL | 0 refills | Status: DC
Start: 2021-05-25 — End: 2021-06-28

## 2021-06-26 ENCOUNTER — Other Ambulatory Visit: Payer: Self-pay | Admitting: Internal Medicine

## 2021-06-26 ENCOUNTER — Encounter: Payer: Self-pay | Admitting: Internal Medicine

## 2021-06-26 DIAGNOSIS — Z1159 Encounter for screening for other viral diseases: Secondary | ICD-10-CM

## 2021-06-27 ENCOUNTER — Other Ambulatory Visit (INDEPENDENT_AMBULATORY_CARE_PROVIDER_SITE_OTHER): Payer: 59

## 2021-06-27 DIAGNOSIS — Z1159 Encounter for screening for other viral diseases: Secondary | ICD-10-CM | POA: Diagnosis not present

## 2021-06-28 ENCOUNTER — Other Ambulatory Visit: Payer: Self-pay

## 2021-06-28 LAB — HEPATITIS B SURFACE ANTIBODY, QUANTITATIVE: Hep B S AB Quant (Post): 199 m[IU]/mL (ref >=10)

## 2021-06-28 MED ORDER — CONTRAVE 8-90 MG PO TB12: ORAL_TABLET | ORAL | 0 refills | Status: DC

## 2022-01-10 ENCOUNTER — Other Ambulatory Visit: Payer: Self-pay | Admitting: Internal Medicine

## 2022-01-10 DIAGNOSIS — I10 Essential (primary) hypertension: Secondary | ICD-10-CM

## 2022-01-10 NOTE — Telephone Encounter (Signed)
LMTCB. Need to schedule pt for an appt. Not been seen since 11/2020.

## 2022-02-01 ENCOUNTER — Encounter: Payer: Self-pay | Admitting: Internal Medicine

## 2022-02-01 ENCOUNTER — Ambulatory Visit: Payer: 59 | Admitting: Internal Medicine

## 2022-02-01 VITALS — BP 116/74 | HR 86 | Temp 97.9°F | Resp 16 | Ht 65.0 in | Wt 221.1 lb

## 2022-02-01 DIAGNOSIS — Z6835 Body mass index (BMI) 35.0-35.9, adult: Secondary | ICD-10-CM

## 2022-02-01 DIAGNOSIS — E032 Hypothyroidism due to medicaments and other exogenous substances: Secondary | ICD-10-CM

## 2022-02-01 DIAGNOSIS — F411 Generalized anxiety disorder: Secondary | ICD-10-CM

## 2022-02-01 DIAGNOSIS — Z1159 Encounter for screening for other viral diseases: Secondary | ICD-10-CM

## 2022-02-01 DIAGNOSIS — R7301 Impaired fasting glucose: Secondary | ICD-10-CM | POA: Diagnosis not present

## 2022-02-01 DIAGNOSIS — Z6831 Body mass index (BMI) 31.0-31.9, adult: Secondary | ICD-10-CM

## 2022-02-01 DIAGNOSIS — I10 Essential (primary) hypertension: Secondary | ICD-10-CM

## 2022-02-01 DIAGNOSIS — R5383 Other fatigue: Secondary | ICD-10-CM

## 2022-02-01 DIAGNOSIS — E6609 Other obesity due to excess calories: Secondary | ICD-10-CM

## 2022-02-01 DIAGNOSIS — E785 Hyperlipidemia, unspecified: Secondary | ICD-10-CM

## 2022-02-01 DIAGNOSIS — Z1231 Encounter for screening mammogram for malignant neoplasm of breast: Secondary | ICD-10-CM

## 2022-02-01 DIAGNOSIS — F410 Panic disorder [episodic paroxysmal anxiety] without agoraphobia: Secondary | ICD-10-CM

## 2022-02-01 DIAGNOSIS — Z114 Encounter for screening for human immunodeficiency virus [HIV]: Secondary | ICD-10-CM

## 2022-02-01 LAB — CBC WITH DIFFERENTIAL/PLATELET
Basophils Absolute: 0.1 10*3/uL (ref 0.0–0.1)
Basophils Relative: 1 % (ref 0.0–3.0)
Eosinophils Absolute: 0.1 10*3/uL (ref 0.0–0.7)
Eosinophils Relative: 2.1 % (ref 0.0–5.0)
HCT: 37.1 % (ref 36.0–46.0)
Hemoglobin: 13.2 g/dL (ref 12.0–15.0)
Lymphocytes Relative: 9.9 % — ABNORMAL LOW (ref 12.0–46.0)
Lymphs Abs: 0.6 10*3/uL — ABNORMAL LOW (ref 0.7–4.0)
MCHC: 35.6 g/dL (ref 30.0–36.0)
MCV: 88.3 fl (ref 78.0–100.0)
Monocytes Absolute: 0.3 10*3/uL (ref 0.1–1.0)
Monocytes Relative: 4.9 % (ref 3.0–12.0)
Neutro Abs: 5.1 10*3/uL (ref 1.4–7.7)
Neutrophils Relative %: 82.1 % — ABNORMAL HIGH (ref 43.0–77.0)
Platelets: 211 10*3/uL (ref 150.0–400.0)
RBC: 4.2 Mil/uL (ref 3.87–5.11)
RDW: 14.6 % (ref 11.5–15.5)
WBC: 6.2 10*3/uL (ref 4.0–10.5)

## 2022-02-01 LAB — MICROALBUMIN / CREATININE URINE RATIO
Creatinine,U: 270.5 mg/dL
Microalb Creat Ratio: 0.6 mg/g (ref 0.0–30.0)
Microalb, Ur: 1.6 mg/dL (ref 0.0–1.9)

## 2022-02-01 LAB — COMPREHENSIVE METABOLIC PANEL
ALT: 7 U/L (ref 0–35)
AST: 8 U/L (ref 0–37)
Albumin: 4.3 g/dL (ref 3.5–5.2)
Alkaline Phosphatase: 36 U/L — ABNORMAL LOW (ref 39–117)
BUN: 13 mg/dL (ref 6–23)
CO2: 29 mEq/L (ref 19–32)
Calcium: 8.7 mg/dL (ref 8.4–10.5)
Chloride: 103 mEq/L (ref 96–112)
Creatinine, Ser: 0.67 mg/dL (ref 0.40–1.20)
GFR: 104.9 mL/min (ref >60.00)
Glucose, Bld: 96 mg/dL (ref 70–99)
Potassium: 3.6 mEq/L (ref 3.5–5.1)
Sodium: 140 mEq/L (ref 135–145)
Total Bilirubin: 1 mg/dL (ref 0.2–1.2)
Total Protein: 6.5 g/dL (ref 6.0–8.3)

## 2022-02-01 LAB — LIPID PANEL
Cholesterol: 139 mg/dL (ref 0–200)
HDL: 40.5 mg/dL (ref >39.00)
LDL Cholesterol: 83 mg/dL (ref 0–99)
NonHDL: 98.43
Total CHOL/HDL Ratio: 3
Triglycerides: 76 mg/dL (ref 0.0–149.0)
VLDL: 15.2 mg/dL (ref 0.0–40.0)

## 2022-02-01 LAB — HEMOGLOBIN A1C: Hgb A1c MFr Bld: 4 % — ABNORMAL LOW (ref 4.6–6.5)

## 2022-02-01 LAB — LDL CHOLESTEROL, DIRECT: Direct LDL: 88 mg/dL

## 2022-02-01 LAB — TSH: TSH: 4.95 u[IU]/mL (ref 0.35–5.50)

## 2022-02-01 MED ORDER — METHOCARBAMOL 750 MG PO TABS: 750.0000 mg | ORAL_TABLET | Freq: Four times a day (QID) | ORAL | 3 refills | Status: DC | PRN

## 2022-02-01 MED ORDER — OMEPRAZOLE 20 MG PO CPDR: 20.0000 mg | DELAYED_RELEASE_CAPSULE | Freq: Every day | ORAL | 3 refills | Status: AC

## 2022-02-01 NOTE — Patient Instructions (Signed)
Please return in 6 months for your CPE with PAP smear  Your annual mammogram has been ordered.  You are encouraged (required) to call to make your appointment at [facility] [phone]

## 2022-02-01 NOTE — Progress Notes (Unsigned)
Subjective:  Patient ID: [name], female    DOB: 1975-12-02  Age: 47 y.o. MRN: [id number]  CC: The primary encounter diagnosis was Essential hypertension. Diagnoses of Iatrogenic hypothyroidism, Other fatigue, Hyperlipidemia, unspecified hyperlipidemia type, Impaired fasting glucose, Encounter for screening mammogram for malignant neoplasm of breast, Encounter for screening for HIV, and Need for hepatitis C screening test were also pertinent to this visit.   HPI [name] presents for follow up on chronic issues.  Last seen November 2022  Chief Complaint  Patient presents with   Medication Refill    1) chronic neck pain  2 level ACDF fusion Jan 2022.  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  Using the standing desk to avoid flexion .  Avoiding prolonged rotation. Using ibuprofen 3 times per week .    2) hypothyroid:  no checks since Nov 2022.  No symptoms   3) depression/anxiety:  stable on zoloft  4) Obesity: did not tolerate Contrave due to persistent nausea .  Eating 2000 calories daily, not exercising due to working full time In [place],  2 kids at home (13 and 18)   5) amenorrhea secondary to uterine ablation . In  2018  No spotting .  Last PAP2 years ago?   Outpatient Medications Prior to Visit  Medication Sig Dispense Refill   fluticasone (FLONASE) 50 MCG/ACT nasal spray Place 2 sprays into both nostrils daily. 16 g 2   ibuprofen (ADVIL,MOTRIN) 600 MG tablet Take 1 tablet (600 mg total) every 6 (six) hours as needed by mouth. 30 tablet 0   levothyroxine (SYNTHROID) 300 MCG tablet TAKE 1 TABLET BY MOUTH DAILY  BEFORE BREAKFAST 90 tablet 3   sertraline (ZOLOFT) 50 MG tablet TAKE 1 AND 1/2 TABLETS BY MOUTH  DAILY 135 tablet 0   triamterene-hydrochlorothiazide (MAXZIDE-25) 37.5-25 MG tablet TAKE 1 TABLET BY MOUTH DAILY 90 tablet 0   Naltrexone-buPROPion HCl ER (CONTRAVE) 8-90 MG TB12 Start 1 tablet every morning for 7 days, then 1 tablet  twice daily for 7 days, then 2 tablets every morning and one every evening (Patient not taking: Reported on 02/01/2022) 120 tablet 0   pregabalin (LYRICA) 100 MG capsule Take 2 capsules (200 mg total) by mouth 3 (three) times daily. (Patient not taking: Reported on 02/01/2022) 180 capsule 0   tirzepatide (MOUNJARO) 2.5 MG/0.5ML Pen Inject 2.5 mg into the skin once a week. (Patient not taking: Reported on 02/01/2022) 2 mL 2   tiZANidine (ZANAFLEX) 4 MG tablet Take by mouth. (Patient not taking: Reported on 02/01/2022)     No facility-administered medications prior to visit.    Review of Systems;  Patient denies headache, fevers, malaise, unintentional weight loss, skin rash, eye pain, sinus congestion and sinus pain, sore throat, dysphagia,  hemoptysis , cough, dyspnea, wheezing, chest pain, palpitations, orthopnea, edema, abdominal pain, nausea, melena, diarrhea, constipation, flank pain, dysuria, hematuria, urinary  Frequency, nocturia, numbness, tingling, seizures,  Focal weakness, Loss of consciousness,  Tremor, insomnia, depression, anxiety, and suicidal ideation.      Objective:  BP 116/74   Pulse 86   Temp 97.9 F (36.6 C)   Resp 16   Ht 5\' 5"  (1.651 m)   Wt 221 lb 2 oz (100.3 kg)   SpO2 97%   BMI 36.80 kg/m   BP Readings from Last 3 Encounters:  02/01/22 116/74  11/20/20 114/70  11/14/20 120/80    Wt Readings from Last 3 Encounters:  02/01/22  221 lb 2 oz (100.3 kg)  11/20/20 222 lb 6.4 oz (100.9 kg)  11/14/20 217 lb 12.8 oz (98.8 kg)    Physical Exam  Lab Results  Component Value Date   HGBA1C 4.4 11/21/2020   HGBA1C 4.2 (L) 11/11/2019   HGBA1C 4.1 03/12/2019    Lab Results  Component Value Date   CREATININE 0.76 11/20/2020   CREATININE 0.69 11/11/2019   CREATININE 0.77 03/12/2019    Lab Results  Component Value Date   WBC 5.9 11/20/2020   HGB 12.7 11/20/2020   HCT 36.3 11/20/2020   PLT 278.0 11/20/2020   GLUCOSE 93 11/20/2020   CHOL 140 11/11/2019    TRIG 101.0 11/11/2019   HDL 46.30 11/11/2019   LDLDIRECT 95 03/12/2019   LDLCALC 73 11/11/2019   ALT 8 11/20/2020   AST 10 11/20/2020   NA 138 11/20/2020   K 3.5 11/20/2020   CL 100 11/20/2020   CREATININE 0.76 11/20/2020   BUN 10 11/20/2020   CO2 30 11/20/2020   TSH 2.01 11/20/2020   HGBA1C 4.4 11/21/2020   MICROALBUR 0.2 10/02/2012    No results found.  Assessment & Plan:  .Essential hypertension -     Comprehensive metabolic panel -     Microalbumin / creatinine urine ratio  Iatrogenic hypothyroidism -     TSH  Other fatigue -     CBC with Differential/Platelet  Hyperlipidemia, unspecified hyperlipidemia type -     Lipid panel -     LDL cholesterol, direct  Impaired fasting glucose -     Hemoglobin A1c  Encounter for screening mammogram for malignant neoplasm of breast -     3D Screening Mammogram, Left and Right; Future  Encounter for screening for HIV -     HIV Antibody (routine testing w rflx)  Need for hepatitis C screening test -     Hepatitis C antibody     I provided 30 minutes of face-to-face time during this encounter reviewing patient's last visit with me, patient's  most recent visit with cardiology,  nephrology,  and neurology,  recent surgical and non surgical procedures, previous  labs and imaging studies, counseling on currently addressed issues,  and post visit ordering to diagnostics and therapeutics .   Follow-up: No follow-ups on file.   Tracy Mc, MD

## 2022-02-02 LAB — HIV ANTIBODY (ROUTINE TESTING W REFLEX): HIV 1&2 Ab, 4th Generation: NONREACTIVE

## 2022-02-02 LAB — HEPATITIS C ANTIBODY: Hepatitis C Ab: NONREACTIVE

## 2022-02-03 MED ORDER — LEVOTHYROXINE SODIUM 25 MCG PO TABS: 25.0000 ug | ORAL_TABLET | Freq: Every day | ORAL | 3 refills | Status: DC

## 2022-02-03 NOTE — Assessment & Plan Note (Signed)
Dose has been 300 mcg or higher for several years.  (reviewed [facility] Endocrinology records from 2018, last visit) but is currently at the upper limit of normal.  Given her inability to lose weight,  will add 25 mcg to dose.   Lab Results  Component Value Date   TSH 4.95 02/01/2022

## 2022-02-03 NOTE — Assessment & Plan Note (Signed)
Improved management with sertraline. Family has even commented on her improved mood. No changes today.

## 2022-02-03 NOTE — Assessment & Plan Note (Addendum)
Patient has been unable to tolerate Contrave , and the GLP 1 agonists were not covered by her insurance. Encouaged to make lifestyle changes and follow a 1200 calorie diet

## 2022-02-07 LAB — HM MAMMOGRAPHY

## 2022-02-11 ENCOUNTER — Other Ambulatory Visit: Payer: Self-pay

## 2022-03-25 ENCOUNTER — Other Ambulatory Visit: Payer: Self-pay | Admitting: Internal Medicine

## 2022-03-25 DIAGNOSIS — I10 Essential (primary) hypertension: Secondary | ICD-10-CM

## 2022-06-02 ENCOUNTER — Other Ambulatory Visit: Payer: Self-pay | Admitting: Internal Medicine

## 2022-07-19 ENCOUNTER — Other Ambulatory Visit (INDEPENDENT_AMBULATORY_CARE_PROVIDER_SITE_OTHER): Payer: 59

## 2022-07-19 DIAGNOSIS — E032 Hypothyroidism due to medicaments and other exogenous substances: Secondary | ICD-10-CM

## 2022-07-20 LAB — THYROID PANEL WITH TSH
Free Thyroxine Index: 4.7 — ABNORMAL HIGH (ref 1.4–3.8)
T3 Uptake: 32 % (ref 22–35)
T4, Total: 14.6 ug/dL — ABNORMAL HIGH (ref 5.1–11.9)
TSH: 0.14 mIU/L — ABNORMAL LOW

## 2022-07-21 NOTE — Addendum Note (Signed)
Addended by: Sherlene Shams on: 07/21/2022 01:29 PM   Modules accepted: Orders

## 2022-07-21 NOTE — Assessment & Plan Note (Signed)
Reducing dose to 300 mcg daily for suppressed TSH   Lab Results  Component Value Date   TSH 0.14 (L) 07/19/2022

## 2022-07-31 ENCOUNTER — Encounter: Payer: Self-pay | Admitting: Family Medicine

## 2022-07-31 ENCOUNTER — Ambulatory Visit: Payer: 59 | Admitting: Family Medicine

## 2022-07-31 VITALS — BP 126/80 | HR 72 | Temp 97.8°F | Ht 65.0 in | Wt 202.4 lb

## 2022-07-31 DIAGNOSIS — L237 Allergic contact dermatitis due to plants, except food: Secondary | ICD-10-CM | POA: Diagnosis not present

## 2022-07-31 MED ORDER — PREDNISONE 10 MG PO TABS: ORAL_TABLET | ORAL | 0 refills | Status: AC

## 2022-07-31 MED ORDER — METHYLPREDNISOLONE ACETATE 40 MG/ML IJ SUSP
40.0000 mg | Freq: Once | INTRAMUSCULAR | Status: AC
  Administered 2022-07-31: 40 mg via INTRAMUSCULAR

## 2022-07-31 NOTE — Progress Notes (Signed)
Marikay Alar, MD Phone: [phone]  Glean Salen Fuller is a 47 y.o. female who presents today for same-day visit.  Poison ivy: Patient reports getting into poison ivy almost 2 weeks ago when having to clear a tree from her driveway.  She notes is on her right arm and both legs.  She has tried over-the-counter topical hydrocortisone, Benadryl, and TAC new cream.  She washed all her clothes and Dawn soap.  She notes it seems to be getting worse.  Social History   Tobacco Use  Smoking Status Never  Smokeless Tobacco Never    Current Outpatient Medications on File Prior to Visit  Medication Sig Dispense Refill   fluticasone (FLONASE) 50 MCG/ACT nasal spray Place 2 sprays into both nostrils daily. 16 g 2   ibuprofen (ADVIL,MOTRIN) 600 MG tablet Take 1 tablet (600 mg total) every 6 (six) hours as needed by mouth. 30 tablet 0   levothyroxine (SYNTHROID) 300 MCG tablet TAKE 1 TABLET BY MOUTH DAILY  BEFORE BREAKFAST 90 tablet 0   methocarbamol (ROBAXIN) 750 MG tablet Take 1 tablet (750 mg total) by mouth every 6 (six) hours as needed for muscle spasms. 120 tablet 3   omeprazole (PRILOSEC) 20 MG capsule Take 1 capsule (20 mg total) by mouth daily. 90 capsule 3   sertraline (ZOLOFT) 50 MG tablet TAKE 1 AND 1/2 TABLETS BY MOUTH  DAILY 135 tablet 3   triamterene-hydrochlorothiazide (MAXZIDE-25) 37.5-25 MG tablet TAKE 1 TABLET BY MOUTH DAILY 90 tablet 3   No current facility-administered medications on file prior to visit.     ROS see history of present illness  Objective  Physical Exam Vitals:   07/31/22 1100  BP: 126/80  Pulse: 72  Temp: 97.8 F (36.6 C)  SpO2: 96%    BP Readings from Last 3 Encounters:  07/31/22 126/80  02/01/22 116/74  11/20/20 114/70   Wt Readings from Last 3 Encounters:  07/31/22 202 lb 6.4 oz (91.8 kg)  02/01/22 221 lb 2 oz (100.3 kg)  11/20/20 222 lb 6.4 oz (100.9 kg)    Physical Exam Skin:    Comments: Patient with erythematous vesicular rash on  right lower anterior leg and left lower posterior leg, also on right upper and lower arm      Assessment/Plan: Please see individual problem list.  Poison ivy Assessment & Plan: New onset issue.  This has been persistent.  We will give the patient a IM injection of Depo-Medrol 40 mg today.  We will start her on prednisone 40 mg daily for 3 days then 30 mg daily for 3 days then 20 mg daily for 3 days then 10 mg daily for 3 days.  She will start the prednisone taper tomorrow.  She is aware of risk of sleep issues, agitation, appetite increase, and bloating with the prednisone.  If she has any of those issues in excess she will let us know.  If not improving she will let us know.  Orders: -     predniSONE; Take 4 tablets (40 mg total) by mouth daily with breakfast for 3 days, THEN 3 tablets (30 mg total) daily with breakfast for 3 days, THEN 2 tablets (20 mg total) daily with breakfast for 3 days, THEN 1 tablet (10 mg total) daily with breakfast for 3 days.  Dispense: 30 tablet; Refill: 0 -     methylPREDNISolone Acetate     Return if symptoms worsen or fail to improve.   Marikay Alar, MD [facility]

## 2022-07-31 NOTE — Assessment & Plan Note (Signed)
New onset issue.  This has been persistent.  We will give the patient a IM injection of Depo-Medrol 40 mg today.  We will start her on prednisone 40 mg daily for 3 days then 30 mg daily for 3 days then 20 mg daily for 3 days then 10 mg daily for 3 days.  She will start the prednisone taper tomorrow.  She is aware of risk of sleep issues, agitation, appetite increase, and bloating with the prednisone.  If she has any of those issues in excess she will let us know.  If not improving she will let us know.

## 2022-08-05 ENCOUNTER — Encounter: Payer: 59 | Admitting: Internal Medicine

## 2022-08-07 ENCOUNTER — Encounter: Payer: 59 | Admitting: Internal Medicine

## 2022-08-08 ENCOUNTER — Encounter: Payer: 59 | Admitting: Internal Medicine

## 2022-08-17 ENCOUNTER — Telehealth: Payer: Self-pay | Admitting: Internal Medicine

## 2022-08-22 ENCOUNTER — Encounter (INDEPENDENT_AMBULATORY_CARE_PROVIDER_SITE_OTHER): Payer: Self-pay

## 2022-08-30 NOTE — Telephone Encounter (Signed)
Spoke to pharmacy stated manufacture is being switched to Lupin. Informed pt pt is aware of manufacture being changed but wanted to wait for her medication to be filled til after lab work is completed.

## 2022-08-30 NOTE — Telephone Encounter (Signed)
[facility] called in stating that they need provider approval to change manufacture on med levothyroxine (SYNTHROID) 300 MCG tablet. They call back number is [phone] and reference number is [id number].

## 2022-09-03 ENCOUNTER — Other Ambulatory Visit (INDEPENDENT_AMBULATORY_CARE_PROVIDER_SITE_OTHER): Payer: 59

## 2022-09-03 ENCOUNTER — Telehealth: Payer: Self-pay | Admitting: Internal Medicine

## 2022-09-03 DIAGNOSIS — E032 Hypothyroidism due to medicaments and other exogenous substances: Secondary | ICD-10-CM

## 2022-09-03 LAB — TSH: TSH: 23.74 u[IU]/mL — ABNORMAL HIGH (ref 0.35–5.50)

## 2022-09-03 NOTE — Telephone Encounter (Signed)
[facility] called in stating that they need clarification on med levothyroxine (SYNTHROID) 300 MCG tablet from provider. Their call back number is [phone] and reference # is [id number].

## 2022-09-04 ENCOUNTER — Encounter: Payer: Self-pay | Admitting: Internal Medicine

## 2022-09-04 ENCOUNTER — Other Ambulatory Visit: Payer: Self-pay | Admitting: Internal Medicine

## 2022-09-04 DIAGNOSIS — E032 Hypothyroidism due to medicaments and other exogenous substances: Secondary | ICD-10-CM

## 2022-09-04 DIAGNOSIS — E0500 Thyrotoxicosis with diffuse goiter without thyrotoxic crisis or storm: Secondary | ICD-10-CM

## 2022-09-04 MED ORDER — LEVOTHYROXINE SODIUM 25 MCG PO TABS: ORAL_TABLET | ORAL | 0 refills | Status: DC

## 2022-09-04 NOTE — Assessment & Plan Note (Signed)
Advised to add 25 mcg to current 300 mcg dose 5 of 7 days per week .  Lab Results  Component Value Date   TSH 23.74 (H) 09/03/2022

## 2022-09-05 NOTE — Telephone Encounter (Signed)
Verbal was given to change manufacturer for the levothyroxine.

## 2022-09-10 NOTE — Assessment & Plan Note (Signed)
Thyroid function is low on 300 mcg daily and was overactive on 325 mcg daily  will increase dose to 325 mcg daily x 5 days per week and continue 300 mcg all other days   Lab Results  Component Value Date   TSH 23.74 (H) 09/03/2022

## 2022-09-11 ENCOUNTER — Other Ambulatory Visit: Payer: Self-pay

## 2022-09-11 MED ORDER — LEVOTHYROXINE SODIUM 25 MCG PO TABS: ORAL_TABLET | ORAL | 0 refills | Status: DC

## 2022-09-11 NOTE — Telephone Encounter (Signed)
Received a fax from pharmacy stating they need clarification on directions.

## 2022-10-01 ENCOUNTER — Encounter: Payer: Self-pay | Admitting: Internal Medicine

## 2022-10-01 ENCOUNTER — Other Ambulatory Visit (HOSPITAL_COMMUNITY)
Admission: RE | Admit: 2022-10-01 | Discharge: 2022-10-01 | Disposition: A | Discharge status: Home / Self Care | Payer: 59 | Source: Ambulatory Visit | Attending: Internal Medicine | Admitting: Internal Medicine

## 2022-10-01 ENCOUNTER — Ambulatory Visit (INDEPENDENT_AMBULATORY_CARE_PROVIDER_SITE_OTHER): Payer: 59 | Admitting: Internal Medicine

## 2022-10-01 VITALS — BP 114/80 | HR 67 | Ht 65.0 in | Wt 189.4 lb

## 2022-10-01 DIAGNOSIS — E669 Obesity, unspecified: Secondary | ICD-10-CM | POA: Diagnosis not present

## 2022-10-01 DIAGNOSIS — Z124 Encounter for screening for malignant neoplasm of cervix: Secondary | ICD-10-CM | POA: Insufficient documentation

## 2022-10-01 DIAGNOSIS — N760 Acute vaginitis: Secondary | ICD-10-CM

## 2022-10-01 DIAGNOSIS — E785 Hyperlipidemia, unspecified: Secondary | ICD-10-CM | POA: Diagnosis not present

## 2022-10-01 DIAGNOSIS — E66811 Obesity, class 1: Secondary | ICD-10-CM

## 2022-10-01 DIAGNOSIS — I10 Essential (primary) hypertension: Secondary | ICD-10-CM | POA: Diagnosis not present

## 2022-10-01 DIAGNOSIS — R8290 Unspecified abnormal findings in urine: Secondary | ICD-10-CM | POA: Diagnosis not present

## 2022-10-01 DIAGNOSIS — R7301 Impaired fasting glucose: Secondary | ICD-10-CM

## 2022-10-01 DIAGNOSIS — N951 Menopausal and female climacteric states: Secondary | ICD-10-CM | POA: Diagnosis not present

## 2022-10-01 DIAGNOSIS — Z0000 Encounter for general adult medical examination without abnormal findings: Secondary | ICD-10-CM

## 2022-10-01 DIAGNOSIS — Z1211 Encounter for screening for malignant neoplasm of colon: Secondary | ICD-10-CM

## 2022-10-01 DIAGNOSIS — B9689 Other specified bacterial agents as the cause of diseases classified elsewhere: Secondary | ICD-10-CM

## 2022-10-01 DIAGNOSIS — R87610 Atypical squamous cells of undetermined significance on cytologic smear of cervix (ASC-US): Secondary | ICD-10-CM

## 2022-10-01 DIAGNOSIS — E032 Hypothyroidism due to medicaments and other exogenous substances: Secondary | ICD-10-CM

## 2022-10-01 LAB — COMPREHENSIVE METABOLIC PANEL WITH GFR
ALT: 7 U/L (ref 0–35)
AST: 8 U/L (ref 0–37)
Albumin: 4.6 g/dL (ref 3.5–5.2)
Alkaline Phosphatase: 40 U/L (ref 39–117)
BUN: 12 mg/dL (ref 6–23)
CO2: 30 meq/L (ref 19–32)
Calcium: 9.2 mg/dL (ref 8.4–10.5)
Chloride: 100 meq/L (ref 96–112)
Creatinine, Ser: 0.82 mg/dL (ref 0.40–1.20)
GFR: 85.45 mL/min
Glucose, Bld: 84 mg/dL (ref 70–99)
Potassium: 3.6 meq/L (ref 3.5–5.1)
Sodium: 138 meq/L (ref 135–145)
Total Bilirubin: 1.2 mg/dL (ref 0.2–1.2)
Total Protein: 6.8 g/dL (ref 6.0–8.3)

## 2022-10-01 LAB — URINALYSIS, ROUTINE W REFLEX MICROSCOPIC
Bilirubin Urine: NEGATIVE
Hgb urine dipstick: NEGATIVE
Ketones, ur: NEGATIVE
Leukocytes,Ua: NEGATIVE
Nitrite: NEGATIVE
Specific Gravity, Urine: 1.01 (ref 1.000–1.030)
Total Protein, Urine: NEGATIVE
Urine Glucose: NEGATIVE
Urobilinogen, UA: 1 (ref 0.0–1.0)
pH: 7.5 (ref 5.0–8.0)

## 2022-10-01 LAB — LIPID PANEL
Cholesterol: 120 mg/dL (ref 0–200)
HDL: 39.4 mg/dL
LDL Cholesterol: 67 mg/dL (ref 0–99)
NonHDL: 80.98
Total CHOL/HDL Ratio: 3
Triglycerides: 69 mg/dL (ref 0.0–149.0)
VLDL: 13.8 mg/dL (ref 0.0–40.0)

## 2022-10-01 LAB — HEMOGLOBIN A1C: Hgb A1c MFr Bld: 4 % — ABNORMAL LOW (ref 4.6–6.5)

## 2022-10-01 LAB — TSH: TSH: 0.12 u[IU]/mL — ABNORMAL LOW (ref 0.35–5.50)

## 2022-10-01 NOTE — Assessment & Plan Note (Signed)
SHE HAS LOST 32 LBS SINCE JANUARY, RESTRICTING HER CALORIES USING a program called "Lose it" .  She often fasts until 11 am  or eats [place] Yogurt,  prepared meals  for lunch,  and reduced starches weight has plateaued .  Husband and daughter are also participating. In weight loss program.  [name] has lost 50 lbs.

## 2022-10-01 NOTE — Assessment & Plan Note (Addendum)
Thyroid was hyperactive on a 325 mcg dose,  and VERY UNDERACTIVE on 300 mg dose.  In late August, and she endorsed fatigue,  weight plateau , hair loss and joint pain.    She has added 25 mcg 5  days per week since last TSh in late August (for the past 3 weeks)   Lab Results  Component Value Date   TSH 23.74 (H) 09/03/2022

## 2022-10-01 NOTE — Progress Notes (Signed)
Patient ID: [name], female    DOB: 1975/11/17  Age: 47 y.o. MRN: [id number]  The patient is here for annual preventive examination and management of other chronic and acute problems.   The risk factors are reflected in the social history.   The roster of all physicians providing medical care to patient - is listed in the Snapshot section of the chart.   Activities of daily living:  The patient is 100% independent in all ADLs: dressing, toileting, feeding as well as independent mobility   Home safety : The patient has smoke detectors in the home. They wear seatbelts.  There are no unsecured firearms at home. There is no violence in the home.    There is no risks for hepatitis, STDs or HIV. There is no   history of blood transfusion. They have no travel history to infectious disease endemic areas of the world.   The patient has seen their dentist in the last six month. They have seen their eye doctor in the last year. The patinet  denies slight hearing difficulty with regard to whispered voices and some television programs.  They have deferred audiologic testing in the last year.  They do not  have excessive sun exposure. Discussed the need for sun protection: hats, long sleeves and use of sunscreen if there is significant sun exposure.    Diet: the importance of a healthy diet is discussed. They do have a healthy diet.   The benefits of regular aerobic exercise were discussed. The patient  exercises  3 to 5 days per week  for  60 minutes.    Depression screen: there are no signs or vegative symptoms of depression- irritability, change in appetite, anhedonia, sadness/tearfullness.   The following portions of the patient's history were reviewed and updated as appropriate: allergies, current medications, past family history, past medical history,  past surgical history, past social history  and problem list.   Visual acuity was not assessed per patient preference since the patient has regular  follow up with an  ophthalmologist. Hearing and body mass index were assessed and reviewed.    During the course of the visit the patient was educated and counseled about appropriate screening and preventive services including : fall prevention , diabetes screening, nutrition counseling, colorectal cancer screening, and recommended immunizations.    Chief Complaint:  Weight plateau:  she  has intentioally lost 32 lbs since January  but for the last month weight has been on a plateau, coincided with reduction  in levothyroxine dose from 325 mcg daily to 300 mcg resulting in TSH of 23 .  ,  since late August she has add 25 mcg 5 days per week to regimen.         Review of Symptoms  Patient denies headache, fevers, malaise, unintentional weight loss, skin rash, eye pain, sinus congestion and sinus pain, sore throat, dysphagia,  hemoptysis , cough, dyspnea, wheezing, chest pain, palpitations, orthopnea, edema, abdominal pain, nausea, melena, diarrhea, constipation, flank pain, dysuria, hematuria, urinary  Frequency, nocturia, numbness, tingling, seizures,  Focal weakness, Loss of consciousness,  Tremor, insomnia, depression, anxiety, and suicidal ideation.    Physical Exam:  BP 114/80   Pulse 67   Ht 5\' 5"  (1.651 m)   Wt 189 lb 6.4 oz (85.9 kg)   SpO2 99%   BMI 31.52 kg/m    Physical Exam Vitals reviewed.  Constitutional:      General: She is not in acute distress.  Appearance: Normal appearance. She is well-developed and normal weight. She is not ill-appearing, toxic-appearing or diaphoretic.  HENT:     Head: Normocephalic.     Right Ear: Tympanic membrane, ear canal and external ear normal. There is no impacted cerumen.     Left Ear: Tympanic membrane, ear canal and external ear normal. There is no impacted cerumen.     Nose: Nose normal.     Mouth/Throat:     Mouth: Mucous membranes are moist.     Pharynx: Oropharynx is clear.  Eyes:     General: No scleral icterus.       Right  eye: No discharge.        Left eye: No discharge.     Conjunctiva/sclera: Conjunctivae normal.     Pupils: Pupils are equal, round, and reactive to light.  Neck:     Thyroid: No thyromegaly.     Vascular: No carotid bruit or JVD.  Cardiovascular:     Rate and Rhythm: Normal rate and regular rhythm.     Heart sounds: Normal heart sounds.  Pulmonary:     Effort: Pulmonary effort is normal. No respiratory distress.     Breath sounds: Normal breath sounds.  Chest:  Breasts:    Breasts are symmetrical.     Right: Normal. No swelling, inverted nipple, mass, nipple discharge, skin change or tenderness.     Left: Normal. No swelling, inverted nipple, mass, nipple discharge, skin change or tenderness.  Abdominal:     General: Bowel sounds are normal.     Palpations: Abdomen is soft. There is no mass.     Tenderness: There is no abdominal tenderness. There is no guarding or rebound.     Hernia: There is no hernia in the left inguinal area or right inguinal area.  Genitourinary:    Exam position: Lithotomy position.     Pubic Area: No rash or pubic lice.      Labia:        Right: No rash, tenderness, lesion or injury.        Left: No rash, tenderness, lesion or injury.      Vagina: Normal.     Cervix: Normal.     Uterus: Normal.      Adnexa: Right adnexa normal and left adnexa normal.  Musculoskeletal:        General: Normal range of motion.     Cervical back: Normal range of motion and neck supple.  Lymphadenopathy:     Cervical: No cervical adenopathy.     Upper Body:     Right upper body: No supraclavicular, axillary or pectoral adenopathy.     Left upper body: No supraclavicular, axillary or pectoral adenopathy.     Lower Body: No right inguinal adenopathy. No left inguinal adenopathy.  Skin:    General: Skin is warm and dry.  Neurological:     General: No focal deficit present.     Mental Status: She is alert and oriented to person, place, and time. Mental status is at  baseline.  Psychiatric:        Mood and Affect: Mood normal.        Behavior: Behavior normal.        Thought Content: Thought content normal.        Judgment: Judgment normal.    Assessment and Plan: Essential hypertension Assessment & Plan: Well controlled on current regimen of Maxzide only. . Renal functiondue, no changes today.  Orders: -  Comprehensive metabolic panel  Iatrogenic hypothyroidism Assessment & Plan: Thyroid was hyperactive on a 325 mcg dose,  and VERY UNDERACTIVE on 300 mg dose.  In late August, and she endorsed fatigue,  weight plateau , hair loss and joint pain.    She has added 25 mcg 5  days per week since last TSh in late August (for the past 3 weeks)   Lab Results  Component Value Date   TSH 23.74 (H) 09/03/2022     Orders: -     TSH  Encounter for preventive health examination Assessment & Plan: age appropriate education and counseling updated, referrals for preventative services and immunizations addressed, dietary and smoking counseling addressed, most recent labs reviewed.  I have personally reviewed and have noted:   1) the patient's medical and social history 2) The pt's use of alcohol, tobacco, and illicit drugs 3) The patient's current medications and supplements 4) Functional ability including ADL's, fall risk, home safety risk, hearing and visual impairment 5) Diet and physical activities 6) Evidence for depression or mood disorder 7) The patient's height, weight, and BMI have been recorded in the chart  I have made referrals, and provided counseling and education based on review of the above    Hyperlipidemia, unspecified hyperlipidemia type -     Lipid panel  Impaired fasting glucose -     Hemoglobin A1c  Colon cancer screening -     Ambulatory referral to Gastroenterology  Cervical cancer screening -     Cytology - PAP  Obesity (BMI 30.0-34.9) Assessment & Plan: SHE HAS LOST 32 LBS SINCE JANUARY, RESTRICTING HER  CALORIES USING a program called "Lose it" .  She often fasts until 11 am  or eats [place] Yogurt,  prepared meals  for lunch,  and reduced starches weight has plateaued .  Husband and daughter are also participating. In weight loss program.  [place] has lost 50 lbs.    Perimenopause -     FSH/LH  Foul smelling urine -     Urinalysis, Routine w reflex microscopic -     Urine Culture  Atypical squamous cells of undetermined significance (ASCUS) on Papanicolaou smear of cervix Assessment & Plan: endometrial curettage in 2017 was non diagnostic due to paucity of endocervical cells.  PAP smear was done today      No follow-ups on file.  Sherlene Shams, MD

## 2022-10-01 NOTE — Assessment & Plan Note (Signed)

## 2022-10-01 NOTE — Assessment & Plan Note (Signed)
endometrial curettage in 2017 was non diagnostic due to paucity of endocervical cells.  PAP smear was done today

## 2022-10-01 NOTE — Assessment & Plan Note (Signed)
Well controlled on current regimen of Maxzide only. . Renal functiondue, no changes today.

## 2022-10-02 LAB — URINE CULTURE
MICRO NUMBER:: 15508766
Result:: NO GROWTH
SPECIMEN QUALITY:: ADEQUATE

## 2022-10-02 LAB — FSH/LH
FSH: 5.1 m[IU]/mL
LH: 2 m[IU]/mL

## 2022-10-03 LAB — CYTOLOGY - PAP
Adequacy: ABSENT
Comment: NEGATIVE
Diagnosis: NEGATIVE
High risk HPV: NEGATIVE

## 2022-10-04 DIAGNOSIS — B9689 Other specified bacterial agents as the cause of diseases classified elsewhere: Secondary | ICD-10-CM | POA: Insufficient documentation

## 2022-10-04 HISTORY — DX: Other specified bacterial agents as the cause of diseases classified elsewhere: B96.89

## 2022-10-04 MED ORDER — METRONIDAZOLE 500 MG PO TABS: 500.0000 mg | ORAL_TABLET | Freq: Two times a day (BID) | ORAL | 0 refills | Status: DC

## 2022-10-04 NOTE — Addendum Note (Signed)
Addended by: Sherlene Shams on: 10/04/2022 04:29 PM   Modules accepted: Orders

## 2022-10-04 NOTE — Assessment & Plan Note (Signed)
Noted on PAP smear.  Metronidazole prescribed

## 2022-10-08 ENCOUNTER — Telehealth: Payer: Self-pay

## 2022-10-08 ENCOUNTER — Other Ambulatory Visit: Payer: Self-pay

## 2022-10-08 DIAGNOSIS — Z1211 Encounter for screening for malignant neoplasm of colon: Secondary | ICD-10-CM

## 2022-10-08 MED ORDER — NA SULFATE-K SULFATE-MG SULF 17.5-3.13-1.6 GM/177ML PO SOLN
1.0000 | Freq: Once | ORAL | 0 refills | Status: AC
Start: 2022-10-08 — End: 2022-10-08

## 2022-10-08 NOTE — Telephone Encounter (Signed)
Patient is calling because she states she just schedule a colonoscopy and she called her husband tell him and he has a appointment that day and can not take her. She is needing to reschedule the procedure.

## 2022-10-08 NOTE — Telephone Encounter (Signed)
Gastroenterology Pre-Procedure Review  Request Date: 11/08/22 Requesting Physician: Dr. Servando Snare  PATIENT REVIEW QUESTIONS: The patient responded to the following health history questions as indicated:    1. Are you having any GI issues? no 2. Do you have a personal history of Polyps? no 3. Do you have a family history of Colon Cancer or Polyps? no 4. Diabetes Mellitus? no 5. Joint replacements in the past 12 months?no 6. Major health problems in the past 3 months?no 7. Any artificial heart valves, MVP, or defibrillator?no    MEDICATIONS & ALLERGIES:    Patient reports the following regarding taking any anticoagulation/antiplatelet therapy:   Plavix, Coumadin, Eliquis, Xarelto, Lovenox, Pradaxa, Brilinta, or Effient? no Aspirin? no  Patient confirms/reports the following medications:  Current Outpatient Medications  Medication Sig Dispense Refill   Na Sulfate-K Sulfate-Mg Sulf 17.5-3.13-1.6 GM/177ML SOLN Take 1 kit by mouth once for 1 dose. 354 mL 0   fluticasone (FLONASE) 50 MCG/ACT nasal spray Place 2 sprays into both nostrils daily. 16 g 2   ibuprofen (ADVIL,MOTRIN) 600 MG tablet Take 1 tablet (600 mg total) every 6 (six) hours as needed by mouth. 30 tablet 0   levothyroxine (SYNTHROID) 25 MCG tablet Add to current dose of 300 mcg 5 days per week 60 tablet 0   levothyroxine (SYNTHROID) 300 MCG tablet TAKE 1 TABLET BY MOUTH DAILY  BEFORE BREAKFAST 90 tablet 3   methocarbamol (ROBAXIN) 750 MG tablet Take 1 tablet (750 mg total) by mouth every 6 (six) hours as needed for muscle spasms. 120 tablet 3   metroNIDAZOLE (FLAGYL) 500 MG tablet Take 1 tablet (500 mg total) by mouth 2 (two) times daily. 14 tablet 0   omeprazole (PRILOSEC) 20 MG capsule Take 1 capsule (20 mg total) by mouth daily. 90 capsule 3   sertraline (ZOLOFT) 50 MG tablet TAKE 1 AND 1/2 TABLETS BY MOUTH  DAILY 135 tablet 3   triamterene-hydrochlorothiazide (MAXZIDE-25) 37.5-25 MG tablet TAKE 1 TABLET BY MOUTH DAILY 90 tablet  3   No current facility-administered medications for this visit.    Patient confirms/reports the following allergies:  No Known Allergies  No orders of the defined types were placed in this encounter.   AUTHORIZATION INFORMATION Primary Insurance: 1D#: Group #:  Secondary Insurance: 1D#: Group #:  SCHEDULE INFORMATION: Date: 11/08/22 Time: Location: [facility]

## 2022-10-09 NOTE — Telephone Encounter (Signed)
Call returned to patient.  Colonoscopy has been rescheduled from 11/08/22 to 11/15/22 with Dr. Servando Snare at [facility].  Kim at [facility] notified of date change.  Thanks, [place], [place]

## 2022-11-07 ENCOUNTER — Encounter: Payer: Self-pay | Admitting: Gastroenterology

## 2022-11-15 ENCOUNTER — Encounter
Admission: RE | Disposition: A | Discharge status: Home / Self Care | Payer: Self-pay | Source: Home / Self Care | Attending: Gastroenterology

## 2022-11-15 ENCOUNTER — Ambulatory Visit: Payer: 59 | Admitting: General Practice

## 2022-11-15 ENCOUNTER — Ambulatory Visit
Admission: RE | Admit: 2022-11-15 | Discharge: 2022-11-15 | Disposition: A | Discharge status: Home / Self Care | Payer: 59 | Attending: Gastroenterology | Admitting: Gastroenterology

## 2022-11-15 ENCOUNTER — Other Ambulatory Visit: Payer: Self-pay

## 2022-11-15 ENCOUNTER — Encounter: Payer: Self-pay | Admitting: Gastroenterology

## 2022-11-15 DIAGNOSIS — J45909 Unspecified asthma, uncomplicated: Secondary | ICD-10-CM | POA: Diagnosis not present

## 2022-11-15 DIAGNOSIS — E039 Hypothyroidism, unspecified: Secondary | ICD-10-CM | POA: Insufficient documentation

## 2022-11-15 DIAGNOSIS — K635 Polyp of colon: Secondary | ICD-10-CM | POA: Insufficient documentation

## 2022-11-15 DIAGNOSIS — Z8261 Family history of arthritis: Secondary | ICD-10-CM | POA: Diagnosis not present

## 2022-11-15 DIAGNOSIS — Z1211 Encounter for screening for malignant neoplasm of colon: Secondary | ICD-10-CM | POA: Diagnosis present

## 2022-11-15 DIAGNOSIS — F419 Anxiety disorder, unspecified: Secondary | ICD-10-CM | POA: Insufficient documentation

## 2022-11-15 DIAGNOSIS — Z8249 Family history of ischemic heart disease and other diseases of the circulatory system: Secondary | ICD-10-CM | POA: Diagnosis not present

## 2022-11-15 DIAGNOSIS — I10 Essential (primary) hypertension: Secondary | ICD-10-CM | POA: Insufficient documentation

## 2022-11-15 DIAGNOSIS — M1990 Unspecified osteoarthritis, unspecified site: Secondary | ICD-10-CM | POA: Insufficient documentation

## 2022-11-15 DIAGNOSIS — D123 Benign neoplasm of transverse colon: Secondary | ICD-10-CM | POA: Diagnosis not present

## 2022-11-15 HISTORY — DX: Presence of spectacles and contact lenses: Z97.3

## 2022-11-15 HISTORY — PX: POLYPECTOMY: SHX5525

## 2022-11-15 HISTORY — DX: Personal history of other diseases of the circulatory system: Z86.79

## 2022-11-15 HISTORY — PX: COLONOSCOPY WITH PROPOFOL: SHX5780

## 2022-11-15 LAB — POCT PREGNANCY, URINE: Preg Test, Ur: NEGATIVE

## 2022-11-15 SURGERY — COLONOSCOPY WITH PROPOFOL
Anesthesia: General | Site: Rectum

## 2022-11-15 MED ORDER — STERILE WATER FOR IRRIGATION IR SOLN
Status: DC | PRN
  Administered 2022-11-15: 1

## 2022-11-15 MED ORDER — SODIUM CHLORIDE 0.9% FLUSH: 10.0000 mL | INTRAVENOUS | Status: DC | PRN

## 2022-11-15 MED ORDER — PROPOFOL 10 MG/ML IV BOLUS
INTRAVENOUS | Status: DC | PRN
  Administered 2022-11-15 (×4): 20 mg via INTRAVENOUS
  Administered 2022-11-15: 80 mg via INTRAVENOUS
  Administered 2022-11-15: 20 mg via INTRAVENOUS

## 2022-11-15 MED ORDER — SODIUM CHLORIDE 0.9 % IV SOLN: INTRAVENOUS | Status: DC

## 2022-11-15 MED ORDER — LIDOCAINE HCL (CARDIAC) PF 100 MG/5ML IV SOSY
PREFILLED_SYRINGE | INTRAVENOUS | Status: DC | PRN
  Administered 2022-11-15: 40 mg via INTRAVENOUS

## 2022-11-15 SURGICAL SUPPLY — 21 items

## 2022-11-15 NOTE — Op Note (Signed)
[facility] Gastroenterology Patient Name: [name] Procedure Date: 11/15/2022 10:32 AM MRN: [id number] Account #: [id number] Date of Birth: [birth year] Admit Type: Outpatient Age: 47 Room: [facility] OR ROOM 01 Gender: Female Note Status: Finalized Instrument Name: [id number] Procedure:             Colonoscopy Indications:           Screening for colorectal malignant neoplasm Providers:             Midge Minium MD, MD Referring MD:          Duncan Dull, MD (Referring MD) Medicines:             Propofol per Anesthesia Complications:         No immediate complications. Procedure:             Pre-Anesthesia Assessment:                        - Prior to the procedure, a History and Physical was                         performed, and patient medications and allergies were                         reviewed. The patient's tolerance of previous                         anesthesia was also reviewed. The risks and benefits                         of the procedure and the sedation options and risks                         were discussed with the patient. All questions were                         answered, and informed consent was obtained. Prior                         Anticoagulants: The patient has taken no anticoagulant                         or antiplatelet agents. ASA Grade Assessment: II - A                         patient with mild systemic disease. After reviewing                         the risks and benefits, the patient was deemed in                         satisfactory condition to undergo the procedure.                        After obtaining informed consent, the colonoscope was                         passed under direct vision. Throughout the procedure,  the patient's blood pressure, pulse, and oxygen                         saturations were monitored continuously. The                         Colonoscope was introduced through the anus and                          advanced to the the cecum, identified by appendiceal                         orifice and ileocecal valve. The colonoscopy was                         performed without difficulty. The patient tolerated                         the procedure well. The quality of the bowel                         preparation was excellent. Findings:      The perianal and digital rectal examinations were normal.      A 4 mm polyp was found in the transverse colon. The polyp was sessile.       The polyp was removed with a cold snare. Resection and retrieval were       complete.      Two sessile polyps were found in the sigmoid colon. The polyps were 2 to       3 mm in size. These polyps were removed with a cold snare. Resection and       retrieval were complete. Impression:            - One 4 mm polyp in the transverse colon, removed with                         a cold snare. Resected and retrieved.                        - Two 2 to 3 mm polyps in the sigmoid colon, removed                         with a cold snare. Resected and retrieved. Recommendation:        - Discharge patient to home.                        - Resume previous diet.                        - Continue present medications.                        - Await pathology results.                        - If the pathology report reveals adenomatous tissue,                         then repeat the colonoscopy for surveillance  in 5                         years otherwise 10 years. Procedure Code(s):     --- Professional ---                        [phone], Colonoscopy, flexible; with removal of                         tumor(s), polyp(s), or other lesion(s) by snare                         technique Diagnosis Code(s):     --- Professional ---                        Z12.11, Encounter for screening for malignant neoplasm                         of colon                        D12.5, Benign neoplasm of sigmoid colon CPT copyright 2022  American Medical Association. All rights reserved. The codes documented in this report are preliminary and upon coder review may  be revised to meet current compliance requirements. Midge Minium MD, MD 11/15/2022 10:53:26 AM This report has been signed electronically. Number of Addenda: 0 Note Initiated On: 11/15/2022 10:32 AM Scope Withdrawal Time: 0 hours 8 minutes 33 seconds  Total Procedure Duration: 0 hours 11 minutes 58 seconds  Estimated Blood Loss:  Estimated blood loss: none.      [facility]

## 2022-11-15 NOTE — Anesthesia Preprocedure Evaluation (Signed)
Anesthesia Evaluation  Patient identified by MRN, date of birth, ID band Patient awake    Reviewed: Allergy & Precautions, H&P , NPO status , Patient's Chart, lab work & pertinent test results  History of Anesthesia Complications (+) PONV and history of anesthetic complications  Airway Mallampati: II  TM Distance: >3 FB Neck ROM: Full    Dental no notable dental hx. (+) Caps   Pulmonary asthma    Pulmonary exam normal breath sounds clear to auscultation       Cardiovascular hypertension, Normal cardiovascular exam+ dysrhythmias + Valvular Problems/Murmurs  Rhythm:Regular Rate:Normal     Neuro/Psych  Headaches PSYCHIATRIC DISORDERS Anxiety        GI/Hepatic negative GI ROS, Neg liver ROS,,,  Endo/Other  Hypothyroidism    Renal/GU negative Renal ROS  negative genitourinary   Musculoskeletal  (+) Arthritis ,    Abdominal   Peds negative pediatric ROS (+)  Hematology negative hematology ROS (+)   Anesthesia Other Findings    Allergy  Thyroid disease  Irritable bowel syndrome (IBS) Colitis Colon polyps Palpitations  Dysrhythmia Heart murmur  MVP (mitral valve prolapse) Swelling of both hands  Swelling of both lower extremities Hypothyroidism  Asthma Headache  Arthritis Graves disease  Bradycardia Hypertension  PONV (postoperative nausea and vomiting) Wears contact lenses   Hx SVT  Reproductive/Obstetrics negative OB ROS                              Anesthesia Physical Anesthesia Plan  ASA: 3  Anesthesia Plan: General   Post-op Pain Management:    Induction: Intravenous  PONV Risk Score and Plan:   Airway Management Planned: Natural Airway and Nasal Cannula  Additional Equipment:   Intra-op Plan:   Post-operative Plan:   Informed Consent: I have reviewed the patients History and Physical, chart, labs and discussed the procedure including the risks, benefits  and alternatives for the proposed anesthesia with the patient or authorized representative who has indicated his/her understanding and acceptance.     Dental Advisory Given  Plan Discussed with: Anesthesiologist, CRNA and Surgeon  Anesthesia Plan Comments: (Patient consented for risks of anesthesia including but not limited to:  - adverse reactions to medications - risk of airway placement if required - damage to eyes, teeth, lips or other oral mucosa - nerve damage due to positioning  - sore throat or hoarseness - Damage to heart, brain, nerves, lungs, other parts of body or loss of life  Patient voiced understanding and assent.)         Anesthesia Quick Evaluation

## 2022-11-15 NOTE — Transfer of Care (Signed)
Immediate Anesthesia Transfer of Care Note  Patient: [name]  Procedure(s) Performed: COLONOSCOPY WITH PROPOFOL (Rectum) POLYPECTOMY (Rectum)  Patient Location: PACU  Anesthesia Type: General  Level of Consciousness: awake, alert  and patient cooperative  Airway and Oxygen Therapy: Patient Spontanous Breathing and Patient connected to supplemental oxygen  Post-op Assessment: Post-op Vital signs reviewed, Patient's Cardiovascular Status Stable, Respiratory Function Stable, Patent Airway and No signs of Nausea or vomiting  Post-op Vital Signs: Reviewed and stable  Complications: No notable events documented.

## 2022-11-15 NOTE — H&P (Signed)
Midge Minium, MD [facility] [address] [place], [place] [address] Phone: [phone] Fax : [phone]  Primary Care Physician:  Sherlene Shams, MD Primary Gastroenterologist:  Dr. Servando Snare  Pre-Procedure History & Physical: HPI:  [name] is a 47 y.o. female is here for a screening colonoscopy.   Past Medical History:  Diagnosis Date   Allergy    Arthritis    DDD, bulging disk, spinal stenosis   Asthma    childhood   Bradycardia    due to low thyroid   Chicken pox    Colitis 01/07/2002   Colon polyps    Dysrhythmia    SVT   Graves disease    Headache    Migraines   Heart murmur    History of supraventricular tachycardia    Hypertension    extremely high after pregnancy   Hypothyroidism    Irritable bowel syndrome (IBS) 01/07/2002   MVP (mitral valve prolapse)    Palpitations 01/07/1998   PONV (postoperative nausea and vomiting)    Swelling of both hands    Swelling of both lower extremities    Thyroid disease    Wears contact lenses     Past Surgical History:  Procedure Laterality Date   ACDF  01/13/2020   C5-C7   by Sadie Haber , [facility]   CARPAL TUNNEL RELEASE Left 01/07/2010   Two cyst removal   CESAREAN SECTION  2006,2010   CHOLECYSTECTOMY  01/08/1999   COLONOSCOPY     DILITATION & CURRETTAGE/HYSTROSCOPY WITH NOVASURE ABLATION N/A 04/07/2015   Procedure: DILATATION & CURETTAGE/HYSTEROSCOPY WITH NOVASURE ABLATION;  Surgeon: Olivia Mackie, MD;  Location: [facility];  Service: Gynecology;  Laterality: N/A;   MYRINGOTOMY     radioactive iodine  01/08/2012   TONSILECTOMY/ADENOIDECTOMY WITH MYRINGOTOMY     TRANSTHORACIC ECHOCARDIOGRAM     TUBAL LIGATION  01/08/2008    Prior to Admission medications   Medication Sig Start Date End Date Taking? Authorizing Provider  ibuprofen (ADVIL,MOTRIN) 600 MG tablet Take 1 tablet (600 mg total) every 6 (six) hours as needed by mouth. 11/17/16  Yes Enid Derry, PA-C  levothyroxine (SYNTHROID) 25 MCG  tablet Add to current dose of 300 mcg 5 days per week 09/11/22  Yes Worthy Rancher B, FNP  levothyroxine (SYNTHROID) 300 MCG tablet TAKE 1 TABLET BY MOUTH DAILY  BEFORE BREAKFAST 08/19/22  Yes Sherlene Shams, MD  methocarbamol (ROBAXIN) 750 MG tablet Take 1 tablet (750 mg total) by mouth every 6 (six) hours as needed for muscle spasms. 02/01/22  Yes Sherlene Shams, MD  sertraline (ZOLOFT) 50 MG tablet TAKE 1 AND 1/2 TABLETS BY MOUTH  DAILY 03/26/22  Yes Sherlene Shams, MD  triamterene-hydrochlorothiazide (MAXZIDE-25) 37.5-25 MG tablet TAKE 1 TABLET BY MOUTH DAILY 03/26/22  Yes Sherlene Shams, MD  fluticasone (FLONASE) 50 MCG/ACT nasal spray Place 2 sprays into both nostrils daily. Patient not taking: Reported on 11/15/2022 04/19/19   Sherlene Shams, MD  omeprazole (PRILOSEC) 20 MG capsule Take 1 capsule (20 mg total) by mouth daily. Patient not taking: Reported on 11/07/2022 02/01/22   Sherlene Shams, MD    Allergies as of 10/08/2022   (No Known Allergies)    Family History  Problem Relation Age of Onset   Alcohol abuse Mother    Arthritis Mother    Hyperlipidemia Mother    Hypertension Mother    Arthritis Father    Stroke Maternal Grandfather    Heart disease Maternal Grandfather  Heart disease Paternal Grandfather    Celiac disease Daughter 45    Social History   Socioeconomic History   Marital status: Married    Spouse name: Not on file   Number of children: Not on file   Years of education: Not on file   Highest education level: Not on file  Occupational History   Not on file  Tobacco Use   Smoking status: Never   Smokeless tobacco: Never   Tobacco comments:    Smoked some "socially" over 20 yrs ago  Vaping Use   Vaping status: Never Used  Substance and Sexual Activity   Alcohol use: Yes    Comment: Rare   Drug use: No   Sexual activity: Yes    Partners: Male    Birth control/protection: Surgical  Other Topics Concern   Not on file  Social History Narrative    Married: 2 kids: ages 56 and 4.5 yrs   Work: [employer]       Regular exercise: not at this time   Caffeine use: coffee in the AM: dt soda at night               Social Determinants of Health   Financial Resource Strain: Not on file  Food Insecurity: Not on file  Transportation Needs: Not on file  Physical Activity: Not on file  Stress: Not on file  Social Connections: Not on file  Intimate Partner Violence: Not on file    Review of Systems: See HPI, otherwise negative ROS  Physical Exam: BP 120/85   Temp 98.5 F (36.9 C) (Temporal)   Resp 12   Ht 5\' 5"  (1.651 m)   Wt 86.1 kg   SpO2 99%   BMI 31.60 kg/m  General:   Alert,  pleasant and cooperative in NAD Head:  Normocephalic and atraumatic. Neck:  Supple; no masses or thyromegaly. Lungs:  Clear throughout to auscultation.    Heart:  Regular rate and rhythm. Abdomen:  Soft, nontender and nondistended. Normal bowel sounds, without guarding, and without rebound.   Neurologic:  Alert and  oriented x4;  grossly normal neurologically.  Impression/Plan: [name] is now here to undergo a screening colonoscopy.  Risks, benefits, and alternatives regarding colonoscopy have been reviewed with the patient.  Questions have been answered.  All parties agreeable.

## 2022-11-15 NOTE — Anesthesia Postprocedure Evaluation (Signed)
Anesthesia Post Note  Patient: [name]  Procedure(s) Performed: COLONOSCOPY WITH PROPOFOL (Rectum) POLYPECTOMY (Rectum)  Patient location during evaluation: PACU Anesthesia Type: General Level of consciousness: awake and alert Pain management: pain level controlled Vital Signs Assessment: post-procedure vital signs reviewed and stable Respiratory status: spontaneous breathing, nonlabored ventilation, respiratory function stable and patient connected to nasal cannula oxygen Cardiovascular status: blood pressure returned to baseline and stable Postop Assessment: no apparent nausea or vomiting Anesthetic complications: no   No notable events documented.   Last Vitals:  Vitals:   11/15/22 1100 11/15/22 1105  BP: 118/79 118/82  Pulse: 62 (!) 57  Resp: 20 16  Temp:  (!) 36.3 C  SpO2: 100% 100%    Last Pain:  Vitals:   11/15/22 1105  TempSrc:   PainSc: 0-No pain                 Shannon Balthazar C [name]

## 2022-11-16 ENCOUNTER — Encounter: Payer: Self-pay | Admitting: Gastroenterology

## 2022-11-18 ENCOUNTER — Encounter: Payer: Self-pay | Admitting: Gastroenterology

## 2022-11-18 LAB — SURGICAL PATHOLOGY

## 2022-12-10 ENCOUNTER — Other Ambulatory Visit: Payer: Self-pay | Admitting: Internal Medicine

## 2023-03-05 ENCOUNTER — Other Ambulatory Visit: Payer: Self-pay | Admitting: Internal Medicine

## 2023-03-05 DIAGNOSIS — I10 Essential (primary) hypertension: Secondary | ICD-10-CM

## 2023-03-24 ENCOUNTER — Ambulatory Visit: Payer: Self-pay | Admitting: Internal Medicine

## 2023-03-24 NOTE — Telephone Encounter (Signed)
 Copied From CRM [id number]. Reason for Triage: Fever reading of 101.6 last night. Over the weekend, patient stated that she has been experiencing body aches as well as a fever. Patient stated this is her second time sick this winter, when she normally does not get sick.    Attempted to call patient x 2 and received message stating "call cannot be completed as dialed". Unable to leave a voicemail. Will re-attempt later today.

## 2023-03-24 NOTE — Telephone Encounter (Signed)
 3rd attempt to call patient. Received message stating that the call cannot be completed as dialed. Routing encounter to the clinic for follow up.

## 2023-03-24 NOTE — Telephone Encounter (Signed)
 FYI

## 2023-03-24 NOTE — Telephone Encounter (Signed)
 Reason for Disposition  Patient is HIGH RISK (e.g., age > 64 years, pregnant, HIV+, or chronic medical condition)  Answer Assessment - Initial Assessment Questions 1. WORST SYMPTOM: "What is your worst symptom?" (e.g., cough, runny nose, muscle aches, headache, sore throat, fever)      101.6 and body aches.    I had a flu shot about 6 weeks ago.   About 6 weeks ago I had the same symptoms and lasted a week along with diarrhea. Now I'm sick again. I've been having body aches that hit me suddenly.  I felt awful.   No nasal congestion.  I have mild diarrhea but I have colitis.    2. ONSET: "When did your flu symptoms start?"      Thur. 3. COUGH: "How bad is the cough?"       No coughing 4. RESPIRATORY DISTRESS: "Describe your breathing."      No 5. FEVER: "Do you have a fever?" If Yes, ask: "What is your temperature, how was it measured, and when did it start?"     Yes 6. EXPOSURE: "Were you exposed to someone with influenza?"       No  7. 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 8. HIGH RISK DISEASE: "Do you have any chronic medical problems?" (e.g., heart or lung disease, asthma, weak immune system, or other HIGH RISK conditions)     I have autoimmune, Grave's disease. 9. PREGNANCY: "Is there any chance you are pregnant?" "When was your last menstrual period?"     Not asked 10. OTHER SYMPTOMS: "Do you have any other symptoms?"  (e.g., runny nose, muscle aches, headache, sore throat)       Body aches, fever  Protocols used: Influenza (Flu) [facility]  Chief Complaint: Body aches, fever Symptoms: above  Feeling bad Frequency: Started Thur. Pertinent Negatives: Patient denies nasal congestion or coughing  Disposition: [] ED /[] Urgent Care (no appt availability in office) / [] Appointment(In office/virtual)/ []  [facility]/ [x] Home Care/ [] Refused Recommended Disposition /[] [facility]/ []  Follow-up  with PCP Additional Notes: No appts until Wed.   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   "I know what to do".   "I'll do the supportive care and call back if my symptoms don't get better or I start getting worse".   Too late for antiviral.

## 2023-03-24 NOTE — Telephone Encounter (Signed)
 2nd attempt. Call cannot be completed, check your directory.   Message from [place] E sent at 03/24/2023  8:07 AM EDT  Copied From CRM #[id number]. Reason for Triage: Fever reading of 101.6 last night. Over the weekend, patient stated that she has been experiencing body aches as well as a fever. Patient stated this is her second time sick this winter, when she normally does not get sick.

## 2023-05-05 ENCOUNTER — Other Ambulatory Visit: Payer: Self-pay | Admitting: Internal Medicine

## 2023-05-08 ENCOUNTER — Other Ambulatory Visit: Payer: Self-pay | Admitting: Internal Medicine

## 2023-05-08 DIAGNOSIS — E032 Hypothyroidism due to medicaments and other exogenous substances: Secondary | ICD-10-CM

## 2023-05-08 DIAGNOSIS — E0500 Thyrotoxicosis with diffuse goiter without thyrotoxic crisis or storm: Secondary | ICD-10-CM

## 2023-05-08 NOTE — Telephone Encounter (Signed)
 Last Tsh was 09/2022 and it was abnormal. Does was changed but it wasn't advised that pt needed to have TSH rechecked in 6 weeks. Does pt need to come in to have checked before refilling the levothyroxine .

## 2023-10-08 ENCOUNTER — Telehealth: Payer: Self-pay

## 2023-10-08 ENCOUNTER — Ambulatory Visit: Payer: Self-pay

## 2023-10-08 NOTE — Telephone Encounter (Signed)
 Pt is scheduled to see Chelsea Chamorro, NP on 10/10/2023.

## 2023-10-08 NOTE — Telephone Encounter (Signed)
 FYI Only or Action Required?: FYI only for provider.  Patient was last seen in primary care on 10/01/2022 by Marylynn Verneita CROME, MD.  Called Nurse Triage reporting Fatigue.  Symptoms began several weeks ago.  Interventions attempted: Rest, hydration, or home remedies.  Symptoms are: unchanged.  Triage Disposition: See PCP When Office is Open (Within 3 Days)  Patient/caregiver understands and will follow disposition?: Yes  Reason for Disposition  [1] MILD weakness (e.g., does not interfere with ability to work, go to school, normal activities) AND [2] persists > 1 week  Answer Assessment - Initial Assessment Questions Call transferred from [facility], patient schedule annual physical via mychart but has symptoms, office wants patient to be triaged. Patient stated has thyroid  issues, compliant with medication. States it feels like been hit by a train and everything is achey and sore. Feels a little better when moving around, but by night time its worse, Taking Ibuprofen  with mild relief.  1. DESCRIPTION: Describe how you are feeling.     Waking up in middle of the night and arms ache, driving, wrist and hands hurt, seatbelt today was bothering hip really bad.   2. SEVERITY: How bad is it?  Can you stand and walk?     Feels like worked out extremely but has not, muscles feel fatigued and achey  3. ONSET: When did these symptoms begin? (e.g., hours, days, weeks, months)     Thyroid  issues so has issues with some joint pain, but last week this feels more muscular and has been worse   4. CAUSE: What do you think is causing the weakness or fatigue? (e.g., not drinking enough fluids, medical problem, trouble sleeping)     Unsure  5. NEW MEDICINES:  Have you started on any new medicines recently? (e.g., opioid pain medicines, benzodiazepines, muscle relaxants, antidepressants, antihistamines, neuroleptics, beta blockers)     No  6. OTHER SYMPTOMS: Do you have any  other symptoms? (e.g., chest pain, fever, cough, SOB, vomiting, diarrhea, bleeding, other areas of pain)     Headaches frequently, neck pain all the time  Protocols used: Weakness (Generalized) and Fatigue-A-AH

## 2023-10-08 NOTE — Telephone Encounter (Signed)
 See triage note. Pt has been scheduled to see Chelsea Aurora, NP.

## 2023-10-08 NOTE — Telephone Encounter (Signed)
 Patient scheduled an appointment for her physical with Dr. Verneita Kettering on 12/19/2023, with the following comment:  Patient comments: Constant fatigue and muscle weakness, and soreness  I spoke with patient and she states she is waking up in middle of the night and all throughout the day, and her arms are achy.  Patient states she feels like it's muscle and joint pain.  Patient states she can still do things, her muscles just feel fatigued.  I transferred call to triage nurse.

## 2023-10-10 ENCOUNTER — Ambulatory Visit: Admitting: Nurse Practitioner

## 2023-10-10 ENCOUNTER — Encounter: Payer: Self-pay | Admitting: Nurse Practitioner

## 2023-10-10 VITALS — BP 124/82 | HR 87 | Temp 98.4°F | Ht 65.0 in | Wt 219.2 lb

## 2023-10-10 DIAGNOSIS — R5383 Other fatigue: Secondary | ICD-10-CM

## 2023-10-10 DIAGNOSIS — M2550 Pain in unspecified joint: Secondary | ICD-10-CM | POA: Diagnosis not present

## 2023-10-10 LAB — HM MAMMOGRAPHY

## 2023-10-10 NOTE — Progress Notes (Signed)
 Established Patient Office Visit  Subjective:  Patient ID: [name], female    DOB: 15-May-1975  Age: 48 y.o. MRN: [id number]  CC:  Chief Complaint  Patient presents with   Acute Visit    Fatigue & Muscle weakness  Ramped up for the past 10 days Sealbeat was hurting thigh area   Discussed the use of a AI scribe software for clinical note transcription with the patient, who gave verbal consent to proceed.  HPI [name] is a 48 year old female who presents with fatigue, muscle achiness, and joint pain.  She experiences significant fatigue and muscle achiness, described as a dull ache that worsens with movement, primarily in her hips, knees, and major joints. Ibuprofen  provides some relief but does not completely alleviate the symptoms. These symptoms have persisted for the past year or two, with recent intensification causing nocturnal awakenings and broken sleep. Morning pain is severe but improves throughout the day, although her arms continue to ache.  There is a family history of arthritis, with her mother having rheumatoid arthritis and her grandmother having osteoarthritis. Her last thyroid  panel in September 2024 showed a low TSH level. She has not had any recent changes in medication.  She experiences inflammatory symptoms, such as tenderness in her nose and patches of inflammation under her eye. Her daughter was diagnosed with celiac disease a year ago, raising concerns about a potential gluten sensitivity or autoimmune component to her symptoms.   HPI   Past Medical History:  Diagnosis Date   Allergy    Arthritis    DDD, bulging disk, spinal stenosis   Asthma    childhood   Bradycardia    due to low thyroid    Chicken pox    Colitis 01/07/2002   Colon polyps    Dysrhythmia    SVT   Graves disease    Headache    Migraines   Heart murmur    History of supraventricular tachycardia    Hypertension    extremely high after pregnancy   Hypothyroidism     Irritable bowel syndrome (IBS) 01/07/2002   MVP (mitral valve prolapse)    Palpitations 01/07/1998   PONV (postoperative nausea and vomiting)    Swelling of both hands    Swelling of both lower extremities    Thyroid  disease    Wears contact lenses     Past Surgical History:  Procedure Laterality Date   ACDF  01/13/2020   C5-C7   by Frederic Don , [facility]   CARPAL TUNNEL RELEASE Left 01/07/2010   Two cyst removal   CESAREAN SECTION  2006,2010   CHOLECYSTECTOMY  01/08/1999   COLONOSCOPY     COLONOSCOPY WITH PROPOFOL  N/A 11/15/2022   Procedure: COLONOSCOPY WITH PROPOFOL ;  Surgeon: Jinny Carmine, MD;  Location: [facility];  Service: Endoscopy;  Laterality: N/A;   DILITATION & CURRETTAGE/HYSTROSCOPY WITH NOVASURE ABLATION N/A 04/07/2015   Procedure: DILATATION & CURETTAGE/HYSTEROSCOPY WITH NOVASURE ABLATION;  Surgeon: Charlie Flowers, MD;  Location: [facility];  Service: Gynecology;  Laterality: N/A;   MYRINGOTOMY     POLYPECTOMY  11/15/2022   Procedure: POLYPECTOMY;  Surgeon: Jinny Carmine, MD;  Location: [facility];  Service: Endoscopy;;   radioactive iodine  01/08/2012   TONSILECTOMY/ADENOIDECTOMY WITH MYRINGOTOMY     TRANSTHORACIC ECHOCARDIOGRAM     TUBAL LIGATION  01/08/2008    Family History  Problem Relation Age of Onset   Alcohol abuse Mother    Arthritis Mother  Hyperlipidemia Mother    Hypertension Mother    Arthritis Father    Stroke Maternal Grandfather    Heart disease Maternal Grandfather    Heart disease Paternal Grandfather    Celiac disease Daughter 81    Social History   Socioeconomic History   Marital status: Married    Spouse name: Not on file   Number of children: Not on file   Years of education: Not on file   Highest education level: Not on file  Occupational History   Not on file  Tobacco Use   Smoking status: Never   Smokeless tobacco: Never   Tobacco comments:    Smoked some socially over 20 yrs ago  Vaping Use    Vaping status: Never Used  Substance and Sexual Activity   Alcohol use: Yes    Comment: Rare   Drug use: No   Sexual activity: Yes    Partners: Male    Birth control/protection: Surgical  Other Topics Concern   Not on file  Social History Narrative   Married: 2 kids: ages 27 and 4.5 yrs   Work: [employer]       Regular exercise: not at this time   Caffeine use: coffee in the AM: dt soda at night               Social Drivers of Corporate investment banker Strain: Not on file  Food Insecurity: Not on file  Transportation Needs: Not on file  Physical Activity: Not on file  Stress: Not on file  Social Connections: Not on file  Intimate Partner Violence: Not on file     Outpatient Medications Prior to Visit  Medication Sig Dispense Refill   ibuprofen  (ADVIL ,MOTRIN ) 600 MG tablet Take 1 tablet (600 mg total) every 6 (six) hours as needed by mouth. 30 tablet 0   levothyroxine  (SYNTHROID ) 25 MCG tablet TAKE 1 TABLET BY MOUTH DAILY  WITH 300 MCG DOSE 90 tablet 3   levothyroxine  (SYNTHROID ) 300 MCG tablet TAKE 1 TABLET BY MOUTH DAILY  BEFORE BREAKFAST 90 tablet 3   methocarbamol  (ROBAXIN ) 750 MG tablet Take 1 tablet (750 mg total) by mouth every 6 (six) hours as needed for muscle spasms. 120 tablet 3   sertraline  (ZOLOFT ) 50 MG tablet TAKE 1 AND 1/2 TABLETS BY MOUTH  DAILY 135 tablet 3   triamterene -hydrochlorothiazide  (MAXZIDE-25) 37.5-25 MG tablet TAKE 1 TABLET BY MOUTH DAILY 90 tablet 3   fluticasone  (FLONASE ) 50 MCG/ACT nasal spray Place 2 sprays into both nostrils daily. (Patient not taking: Reported on 10/10/2023) 16 g 2   omeprazole  (PRILOSEC) 20 MG capsule Take 1 capsule (20 mg total) by mouth daily. (Patient not taking: Reported on 11/07/2022) 90 capsule 3   No facility-administered medications prior to visit.    No Known Allergies  ROS Review of Systems Negative unless indicated in HPI.    Objective:    Physical Exam Constitutional:      Appearance: Normal  appearance.  HENT:     Mouth/Throat:     Mouth: Mucous membranes are moist.  Eyes:     Conjunctiva/sclera: Conjunctivae normal.     Pupils: Pupils are equal, round, and reactive to light.  Cardiovascular:     Rate and Rhythm: Normal rate and regular rhythm.     Pulses: Normal pulses.     Heart sounds: Normal heart sounds.  Pulmonary:     Effort: Pulmonary effort is normal.     Breath sounds: Normal breath  sounds.  Musculoskeletal:        General: Tenderness (Multiple joint tenderness) present.     Cervical back: Normal range of motion. No tenderness.  Skin:    General: Skin is warm.     Findings: No bruising.  Neurological:     General: No focal deficit present.     Mental Status: She is alert and oriented to person, place, and time. Mental status is at baseline.  Psychiatric:        Mood and Affect: Mood normal.        Behavior: Behavior normal.        Thought Content: Thought content normal.        Judgment: Judgment normal.     BP 124/82   Pulse 87   Temp 98.4 F (36.9 C)   Ht 5' 5 (1.651 m)   Wt 219 lb 3.2 oz (99.4 kg)   SpO2 99%   BMI 36.48 kg/m  Wt Readings from Last 3 Encounters:  10/10/23 219 lb 3.2 oz (99.4 kg)  11/15/22 189 lb 14.4 oz (86.1 kg)  10/01/22 189 lb 6.4 oz (85.9 kg)     Health Maintenance  Topic Date Due   Hepatitis B Vaccines 19-59 Average Risk (1 of 3 - 19+ 3-dose series) Never done   COVID-19 Vaccine (3 - 2025-26 season) 10/25/2023 (Originally 09/08/2023)   Influenza Vaccine  04/06/2024 (Originally 08/08/2023)   Mammogram  02/08/2024   DTaP/Tdap/Td (3 - Td or Tdap) 09/23/2027   Cervical Cancer Screening (HPV/Pap Cotest)  10/01/2027   Colonoscopy  11/15/2027   Hepatitis C Screening  Completed   HIV Screening  Completed   Pneumococcal Vaccine  Aged Out   HPV VACCINES  Aged Out   Meningococcal B Vaccine  Aged Out       Topic Date Due   Hepatitis B Vaccines 19-59 Average Risk (1 of 3 - 19+ 3-dose series) Never done    Lab Results   Component Value Date   TSH CANCELED 10/10/2023   TSH 0.361 (L) 10/10/2023   Lab Results  Component Value Date   WBC 6.8 10/10/2023   HGB 13.0 10/10/2023   HCT 37.8 10/10/2023   MCV 92 10/10/2023   PLT 261 10/10/2023   Lab Results  Component Value Date   NA 138 10/01/2022   K 3.6 10/01/2022   CO2 30 10/01/2022   GLUCOSE 84 10/01/2022   BUN 12 10/01/2022   CREATININE 0.82 10/01/2022   BILITOT 1.2 10/01/2022   ALKPHOS 40 10/01/2022   AST 8 10/01/2022   ALT 7 10/01/2022   PROT 6.8 10/01/2022   ALBUMIN 4.6 10/01/2022   CALCIUM 9.2 10/01/2022   ANIONGAP 7 03/31/2015   GFR 85.45 10/01/2022   Lab Results  Component Value Date   CHOL 120 10/01/2022   Lab Results  Component Value Date   HDL 39.40 10/01/2022   Lab Results  Component Value Date   LDLCALC 67 10/01/2022   Lab Results  Component Value Date   TRIG 69.0 10/01/2022   Lab Results  Component Value Date   CHOLHDL 3 10/01/2022   Lab Results  Component Value Date   HGBA1C 4.0 (L) 10/01/2022      Assessment & Plan:  Other fatigue Assessment & Plan: Chronic fatigue and musculoskeletal pain with family history of rheumatoid arthritis and osteoarthritis. Differential includes thyroid  dysfunction, vitamin deficiencies, and autoimmune conditions. Previous low TSH noted. - Order thyroid  panel and TSH. - Order ANA test. - Order vitamin D   and B12 levels. - Continue ibuprofen  as needed.  Orders: -     CBC with Differential/Platelet -     VITAMIN D  25 Hydroxy (Vit-D Deficiency, Fractures) -     Vitamin B12 -     TSH Rfx on Abnormal to Free T4 -     TSH Rfx on Abnormal to Free T4 -     Specimen status report -     T4F  Arthralgia, unspecified joint -     ANA    Follow-up: Return if symptoms worsen or fail to improve.   Abbe Bula, NP

## 2023-10-11 LAB — CBC WITH DIFFERENTIAL/PLATELET
Basophils Absolute: 0 x10E3/uL (ref 0.0–0.2)
Basos: 1 %
EOS (ABSOLUTE): 0.2 x10E3/uL (ref 0.0–0.4)
Eos: 2 %
Hematocrit: 37.8 % (ref 34.0–46.6)
Hemoglobin: 13 g/dL (ref 11.1–15.9)
Immature Grans (Abs): 0 x10E3/uL (ref 0.0–0.1)
Immature Granulocytes: 0 %
Lymphocytes Absolute: 0.9 x10E3/uL (ref 0.7–3.1)
Lymphs: 13 %
MCH: 31.6 pg (ref 26.6–33.0)
MCHC: 34.4 g/dL (ref 31.5–35.7)
MCV: 92 fL (ref 79–97)
Monocytes Absolute: 0.3 x10E3/uL (ref 0.1–0.9)
Monocytes: 5 %
Neutrophils Absolute: 5.4 x10E3/uL (ref 1.4–7.0)
Neutrophils: 78 %
Platelets: 261 x10E3/uL (ref 150–450)
RBC: 4.12 x10E6/uL (ref 3.77–5.28)
RDW: 13.1 % (ref 11.7–15.4)
WBC: 6.8 x10E3/uL (ref 3.4–10.8)

## 2023-10-11 LAB — VITAMIN D 25 HYDROXY (VIT D DEFICIENCY, FRACTURES): Vit D, 25-Hydroxy: 15.1 ng/mL — ABNORMAL LOW (ref 30.0–100.0)

## 2023-10-11 LAB — VITAMIN B12: Vitamin B-12: 238 pg/mL (ref 232–1245)

## 2023-10-11 LAB — ANA: Anti Nuclear Antibody (ANA): NEGATIVE

## 2023-10-11 LAB — TSH RFX ON ABNORMAL TO FREE T4

## 2023-10-12 ENCOUNTER — Encounter: Payer: Self-pay | Admitting: Nurse Practitioner

## 2023-10-12 LAB — T4F: T4,Free (Direct): 1.86 ng/dL — ABNORMAL HIGH (ref 0.82–1.77)

## 2023-10-12 LAB — TSH RFX ON ABNORMAL TO FREE T4: TSH: 0.361 u[IU]/mL — AB (ref 0.450–4.500)

## 2023-10-12 LAB — SPECIMEN STATUS REPORT

## 2023-10-13 ENCOUNTER — Ambulatory Visit: Payer: Self-pay | Admitting: Nurse Practitioner

## 2023-10-13 ENCOUNTER — Encounter: Payer: Self-pay | Admitting: Internal Medicine

## 2023-10-13 DIAGNOSIS — R5383 Other fatigue: Secondary | ICD-10-CM | POA: Insufficient documentation

## 2023-10-13 MED ORDER — VITAMIN D (ERGOCALCIFEROL) 1.25 MG (50000 UNIT) PO CAPS: 50000.0000 [IU] | ORAL_CAPSULE | ORAL | 0 refills | Status: DC

## 2023-10-13 NOTE — Assessment & Plan Note (Signed)
 Chronic fatigue and musculoskeletal pain with family history of rheumatoid arthritis and osteoarthritis. Differential includes thyroid  dysfunction, vitamin deficiencies, and autoimmune conditions. Previous low TSH noted. - Order thyroid  panel and TSH. - Order ANA test. - Order vitamin D  and B12 levels. - Continue ibuprofen  as needed.

## 2023-10-14 ENCOUNTER — Other Ambulatory Visit: Payer: Self-pay | Admitting: Nurse Practitioner

## 2023-10-14 ENCOUNTER — Telehealth: Payer: Self-pay

## 2023-10-14 DIAGNOSIS — R7989 Other specified abnormal findings of blood chemistry: Secondary | ICD-10-CM

## 2023-10-14 MED ORDER — LEVOTHYROXINE SODIUM 200 MCG PO TABS: 200.0000 ug | ORAL_TABLET | Freq: Every day | ORAL | 0 refills | Status: DC

## 2023-10-14 NOTE — Progress Notes (Signed)
 Called pt and changed her thyroid  dose from 300 mcg to 275 mcg. Prescription of 200 mcg sent to [facility]. She has 25 mcg at home, she will take 3 x 25 mcg and 1 x 200 mcg. Total 275 mcg.

## 2023-10-14 NOTE — Telephone Encounter (Signed)
 Left message to call the office and schedule a 6 week nonfasting lab appointment.

## 2023-10-28 ENCOUNTER — Other Ambulatory Visit: Payer: Self-pay | Admitting: Nurse Practitioner

## 2023-12-01 ENCOUNTER — Ambulatory Visit (INDEPENDENT_AMBULATORY_CARE_PROVIDER_SITE_OTHER): Admitting: Internal Medicine

## 2023-12-01 ENCOUNTER — Encounter: Payer: Self-pay | Admitting: Internal Medicine

## 2023-12-01 VITALS — BP 118/80 | HR 76 | Ht 65.0 in | Wt 217.8 lb

## 2023-12-01 DIAGNOSIS — E785 Hyperlipidemia, unspecified: Secondary | ICD-10-CM

## 2023-12-01 DIAGNOSIS — I10 Essential (primary) hypertension: Secondary | ICD-10-CM

## 2023-12-01 DIAGNOSIS — R7989 Other specified abnormal findings of blood chemistry: Secondary | ICD-10-CM

## 2023-12-01 DIAGNOSIS — E032 Hypothyroidism due to medicaments and other exogenous substances: Secondary | ICD-10-CM | POA: Diagnosis not present

## 2023-12-01 DIAGNOSIS — R7301 Impaired fasting glucose: Secondary | ICD-10-CM

## 2023-12-01 DIAGNOSIS — R5383 Other fatigue: Secondary | ICD-10-CM | POA: Diagnosis not present

## 2023-12-01 DIAGNOSIS — F410 Panic disorder [episodic paroxysmal anxiety] without agoraphobia: Secondary | ICD-10-CM

## 2023-12-01 DIAGNOSIS — F411 Generalized anxiety disorder: Secondary | ICD-10-CM

## 2023-12-01 DIAGNOSIS — Z0000 Encounter for general adult medical examination without abnormal findings: Secondary | ICD-10-CM

## 2023-12-01 DIAGNOSIS — E66811 Obesity, class 1: Secondary | ICD-10-CM

## 2023-12-01 MED ORDER — METHOCARBAMOL 750 MG PO TABS: 750.0000 mg | ORAL_TABLET | Freq: Four times a day (QID) | ORAL | 3 refills | Status: AC | PRN

## 2023-12-01 MED ORDER — SERTRALINE HCL 50 MG PO TABS: 75.0000 mg | ORAL_TABLET | Freq: Every day | ORAL | 3 refills | Status: DC

## 2023-12-01 MED ORDER — TRIAMTERENE-HCTZ 37.5-25 MG PO TABS: 1.0000 | ORAL_TABLET | Freq: Every day | ORAL | 3 refills | Status: AC

## 2023-12-01 MED ORDER — TRIAMTERENE-HCTZ 37.5-25 MG PO TABS: 1.0000 | ORAL_TABLET | Freq: Every day | ORAL | 3 refills | Status: DC

## 2023-12-01 MED ORDER — SERTRALINE HCL 50 MG PO TABS: 75.0000 mg | ORAL_TABLET | Freq: Every day | ORAL | 3 refills | Status: AC

## 2023-12-01 MED ORDER — METHOCARBAMOL 750 MG PO TABS: 750.0000 mg | ORAL_TABLET | Freq: Four times a day (QID) | ORAL | 3 refills | Status: DC | PRN

## 2023-12-01 MED ORDER — NALTREXONE-BUPROPION HCL ER 8-90 MG PO TB12: ORAL_TABLET | ORAL | 2 refills | Status: AC

## 2023-12-01 NOTE — Progress Notes (Unsigned)
 Patient ID: [name], female    DOB: 03/15/1975  Age: 48 y.o. MRN: [id number]  The patient is here for annual preventive examination and management of other chronic and acute problems.   The risk factors are reflected in the social history.   The roster of all physicians providing medical care to patient - is listed in the Snapshot section of the chart.   Activities of daily living:  The patient is 100% independent in all ADLs: dressing, toileting, feeding as well as independent mobility   Home safety : The patient has smoke detectors in the home. They wear seatbelts.  There are no unsecured firearms at home. There is no violence in the home.    There is no risks for hepatitis, STDs or HIV. There is no   history of blood transfusion. They have no travel history to infectious disease endemic areas of the world.   The patient has seen their dentist in the last six month. They have seen their eye doctor in the last year. The patinet  denies slight hearing difficulty with regard to whispered voices and some television programs.  They have deferred audiologic testing in the last year.  They do not  have excessive sun exposure. Discussed the need for sun protection: hats, long sleeves and use of sunscreen if there is significant sun exposure.    Diet: the importance of a healthy diet is discussed. They do have a healthy diet.   The benefits of regular aerobic exercise were discussed. The patient  exercises  3 to 5 days per week  for  60 minutes.    Depression screen: there are no signs or vegative symptoms of depression- irritability, change in appetite, anhedonia, sadness/tearfullness.   The following portions of the patient's history were reviewed and updated as appropriate: allergies, current medications, past family history, past medical history,  past surgical history, past social history  and problem list.   Visual acuity was not assessed per patient preference since the patient has regular  follow up with an  ophthalmologist. Hearing and body mass index were assessed and reviewed.    During the course of the visit the patient was educated and counseled about appropriate screening and preventive services including : fall prevention , diabetes screening, nutrition counseling, colorectal cancer screening, and recommended immunizations.    Chief Complaint:   joint pain   Obesity:  frustrated. At her inabilty to lose weight. Protein shake for breakfast,  salad/cottage cheese for lunch,  dinner more difficult: daughter has celiac disease .   Stays under 1339 cal daily  via [employer]  walking for exercise.     Review of Symptoms  Patient denies headache, fevers, malaise, unintentional weight loss, skin rash, eye pain, sinus congestion and sinus pain, sore throat, dysphagia,  hemoptysis , cough, dyspnea, wheezing, chest pain, palpitations, orthopnea, edema, abdominal pain, nausea, melena, diarrhea, constipation, flank pain, dysuria, hematuria, urinary  Frequency, nocturia, numbness, tingling, seizures,  Focal weakness, Loss of consciousness,  Tremor, insomnia, depression, anxiety, and suicidal ideation.    Physical Exam:  BP 118/80   Pulse 76   Ht 5' 5 (1.651 m)   Wt 217 lb 12.8 oz (98.8 kg)   SpO2 99%   BMI 36.24 kg/m    Physical Exam  Assessment and Plan: Iatrogenic hypothyroidism  Essential hypertension -     Triamterene -HCTZ; Take 1 tablet by mouth daily.  Dispense: 90 tablet; Refill: 3 -     Comprehensive metabolic panel  with GFR -     Microalbumin / creatinine urine ratio  Encounter for preventive health examination  Other fatigue -     CBC with Differential/Platelet  Impaired fasting glucose -     Comprehensive metabolic panel with GFR -     Hemoglobin A1c  Hyperlipidemia, unspecified hyperlipidemia type -     Lipid panel -     LDL cholesterol, direct  Abnormal TSH -     TSH Rfx on Abnormal to Free T4  Other orders -     Sertraline  HCl; Take  1.5 tablets (75 mg total) by mouth daily.  Dispense: 135 tablet; Refill: 3 -     Methocarbamol ; Take 1 tablet (750 mg total) by mouth every 6 (six) hours as needed for muscle spasms.  Dispense: 120 tablet; Refill: 3    No follow-ups on file.  Tracy LITTIE Kettering, MD

## 2023-12-01 NOTE — Patient Instructions (Addendum)
 PLEASE SCHEDULE A LAB APPT IN EARLY DECEMBER TO RECHECK THYROID  ETC (6 WEEKS OR MORE AFTER YOUR THYROID  DOSE CHANGE)  Today we discussed a weight loss  medication called Contrave  that is effective for helping  emotional eaters  lose or maintain previous weight loss. It is available for $100/month through [employer]  (patient support program by [employer] )  and will come from their mail order pharmacy .  If you are interested in starting this medication,  scan the qr code on the handout,  which will take you to their website to set up your account.   Their phone number is [phone]  Contrave  contains wellbutrin  and naltrexone    in a dose that is gradually increased over the first 4 weeks     the dose will gradually increase to 180 mg twice daily by Week 4

## 2023-12-02 ENCOUNTER — Encounter: Payer: Self-pay | Admitting: Internal Medicine

## 2023-12-02 NOTE — Assessment & Plan Note (Signed)
Improved management with sertraline. Family has even commented on her improved mood. No changes today. 

## 2023-12-02 NOTE — Assessment & Plan Note (Signed)

## 2023-12-02 NOTE — Assessment & Plan Note (Signed)
 Well controlled on current regimen of Maxzide only. . Renal function is overdue  Lab Results  Component Value Date   CREATININE 0.82 10/01/2022

## 2023-12-02 NOTE — Assessment & Plan Note (Signed)
 Counselling on weight management given including need for more vigorous exercise .  SABRATrial of  Contrave  recommended

## 2023-12-02 NOTE — Assessment & Plan Note (Addendum)
 Thyroid  dose was reduced to 275 mcg in early October.after TSH was suppressed and T4 was elevated.  She will return for  Repeat TSH due after 6 weeks of therapy Lab Results  Component Value Date   TSH CANCELED 10/10/2023   TSH 0.361 (L) 10/10/2023

## 2023-12-08 ENCOUNTER — Other Ambulatory Visit: Payer: Self-pay | Admitting: Internal Medicine

## 2023-12-19 ENCOUNTER — Encounter: Admitting: Internal Medicine

## 2023-12-26 ENCOUNTER — Other Ambulatory Visit

## 2023-12-26 DIAGNOSIS — E785 Hyperlipidemia, unspecified: Secondary | ICD-10-CM

## 2023-12-26 DIAGNOSIS — E032 Hypothyroidism due to medicaments and other exogenous substances: Secondary | ICD-10-CM

## 2023-12-26 DIAGNOSIS — I10 Essential (primary) hypertension: Secondary | ICD-10-CM | POA: Diagnosis not present

## 2023-12-26 DIAGNOSIS — R7301 Impaired fasting glucose: Secondary | ICD-10-CM | POA: Diagnosis not present

## 2023-12-26 DIAGNOSIS — R7989 Other specified abnormal findings of blood chemistry: Secondary | ICD-10-CM | POA: Diagnosis not present

## 2023-12-26 LAB — LIPID PANEL
Cholesterol: 146 mg/dL (ref 28–200)
HDL: 47 mg/dL
LDL Cholesterol: 88 mg/dL (ref 10–99)
NonHDL: 98.76
Total CHOL/HDL Ratio: 3
Triglycerides: 56 mg/dL (ref 10.0–149.0)
VLDL: 11.2 mg/dL (ref 0.0–40.0)

## 2023-12-26 LAB — COMPREHENSIVE METABOLIC PANEL WITH GFR
ALT: 9 U/L (ref 3–35)
AST: 8 U/L (ref 5–37)
Albumin: 4.3 g/dL (ref 3.5–5.2)
Alkaline Phosphatase: 37 U/L — ABNORMAL LOW (ref 39–117)
BUN: 15 mg/dL (ref 6–23)
CO2: 30 meq/L (ref 19–32)
Calcium: 8.9 mg/dL (ref 8.4–10.5)
Chloride: 103 meq/L (ref 96–112)
Creatinine, Ser: 0.76 mg/dL (ref 0.40–1.20)
GFR: 92.8 mL/min
Glucose, Bld: 84 mg/dL (ref 70–99)
Potassium: 3.8 meq/L (ref 3.5–5.1)
Sodium: 140 meq/L (ref 135–145)
Total Bilirubin: 0.7 mg/dL (ref 0.2–1.2)
Total Protein: 6.3 g/dL (ref 6.0–8.3)

## 2023-12-26 LAB — MICROALBUMIN / CREATININE URINE RATIO
Creatinine,U: 208 mg/dL
Microalb Creat Ratio: 6.4 mg/g (ref 0.0–30.0)
Microalb, Ur: 1.3 mg/dL (ref 0.7–1.9)

## 2023-12-26 LAB — LDL CHOLESTEROL, DIRECT: Direct LDL: 99 mg/dL

## 2023-12-26 LAB — HEMOGLOBIN A1C: Hgb A1c MFr Bld: 4 % — ABNORMAL LOW (ref 4.6–6.5)

## 2023-12-27 LAB — TSH RFX ON ABNORMAL TO FREE T4: TSH: 4.88 u[IU]/mL — ABNORMAL HIGH (ref 0.450–4.500)

## 2023-12-27 LAB — THYROID PANEL WITH TSH
Free Thyroxine Index: 2.1 (ref 1.4–3.8)
T3 Uptake: 27 % (ref 22–35)
T4, Total: 7.7 ug/dL (ref 5.1–11.9)
TSH: 4.68 m[IU]/L — ABNORMAL HIGH

## 2023-12-27 LAB — T4F: T4,Free (Direct): 1.1 ng/dL (ref 0.82–1.77)

## 2023-12-28 ENCOUNTER — Ambulatory Visit: Payer: Self-pay | Admitting: Internal Medicine

## 2023-12-28 DIAGNOSIS — E032 Hypothyroidism due to medicaments and other exogenous substances: Secondary | ICD-10-CM

## 2023-12-30 MED ORDER — LEVOTHYROXINE SODIUM 300 MCG PO TABS: 300.0000 ug | ORAL_TABLET | Freq: Every day | ORAL | 0 refills | Status: DC

## 2023-12-30 NOTE — Assessment & Plan Note (Signed)
"   thyroid   function is underactive. On 275 mcg  Dose increased to 300 mcg  "

## 2024-01-07 ENCOUNTER — Other Ambulatory Visit: Payer: Self-pay | Admitting: Internal Medicine

## 2024-01-07 MED ORDER — LEVOTHYROXINE SODIUM 300 MCG PO TABS: 300.0000 ug | ORAL_TABLET | Freq: Every day | ORAL | 1 refills | Status: AC

## 2024-06-03 ENCOUNTER — Ambulatory Visit: Admitting: Internal Medicine
